# Patient Record
Sex: Female | Born: 1990 | Race: White | Hispanic: No | Marital: Single | Smoking: Former smoker
Health system: Southern US, Community
[De-identification: ages and names within clinical notes are randomized; demographics above are authoritative.]

## PROBLEM LIST (undated history)

## (undated) ENCOUNTER — Ambulatory Visit: Payer: BC Managed Care – PPO | Source: Home / Self Care

## (undated) DIAGNOSIS — F419 Anxiety disorder, unspecified: Secondary | ICD-10-CM

## (undated) DIAGNOSIS — IMO0002 Reserved for concepts with insufficient information to code with codable children: Secondary | ICD-10-CM

## (undated) HISTORY — DX: Reserved for concepts with insufficient information to code with codable children: IMO0002

## (undated) HISTORY — PX: NECK SURGERY: SHX720

---
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
## 2009-09-19 ENCOUNTER — Emergency Department: Payer: Self-pay | Admitting: Emergency Medicine

## 2010-03-24 ENCOUNTER — Emergency Department: Payer: Self-pay | Admitting: Emergency Medicine

## 2010-03-25 ENCOUNTER — Emergency Department: Payer: Self-pay | Admitting: Emergency Medicine

## 2010-03-25 IMAGING — CR DG KNEE COMPLETE 4+V*L*
1 series · 4 of 4 positions shown · non-contrast
Comparison: none

REASON FOR EXAM: trauma, pain
COMMENTS:

PROCEDURE:     DXR - DXR KNEE LT COMP WITH OBLIQUES  - [DATE]  [DATE]
RESULT:     Bipartite patella is noted. No evidence of acute fracture. Soft
tissues are normal.

[Series 1: view not recorded · 0.17mm/px · 4 of 4 slices shown]
[im 1/4]
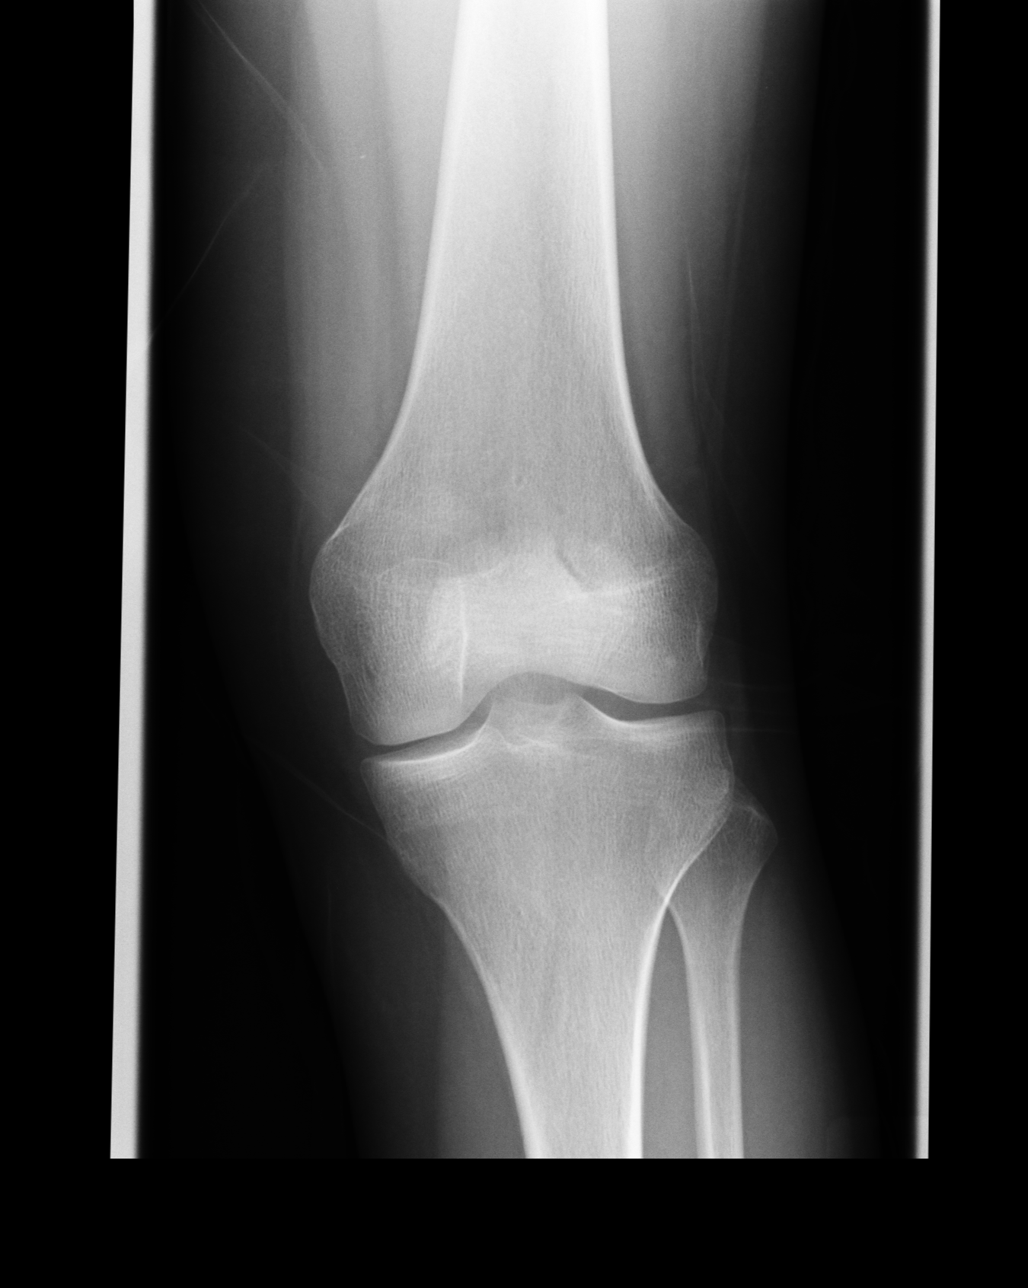
[im 2/4]
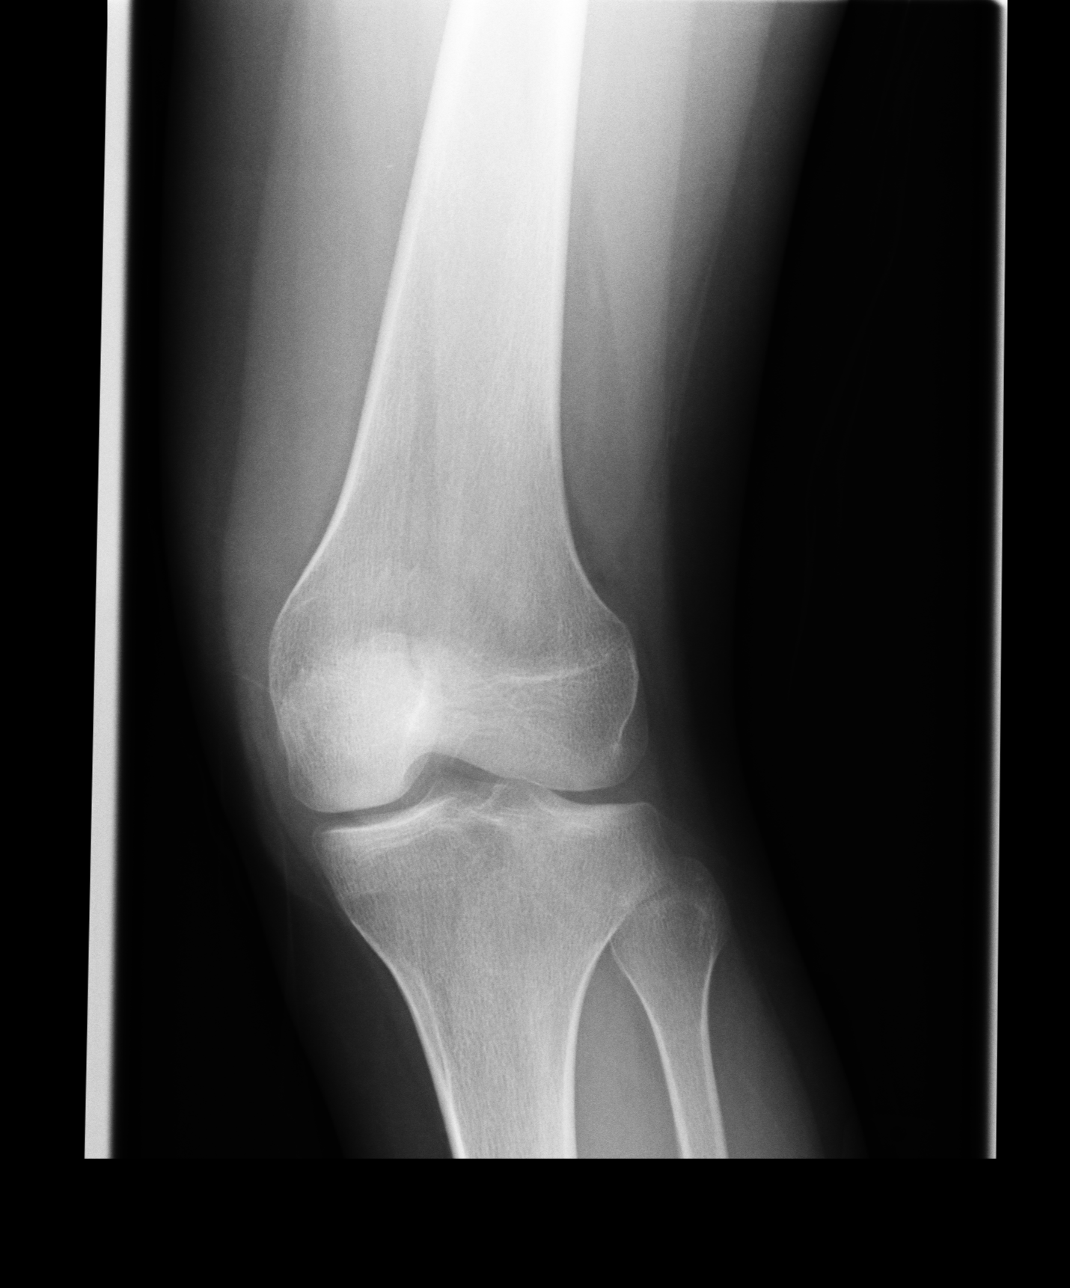
[im 3/4]
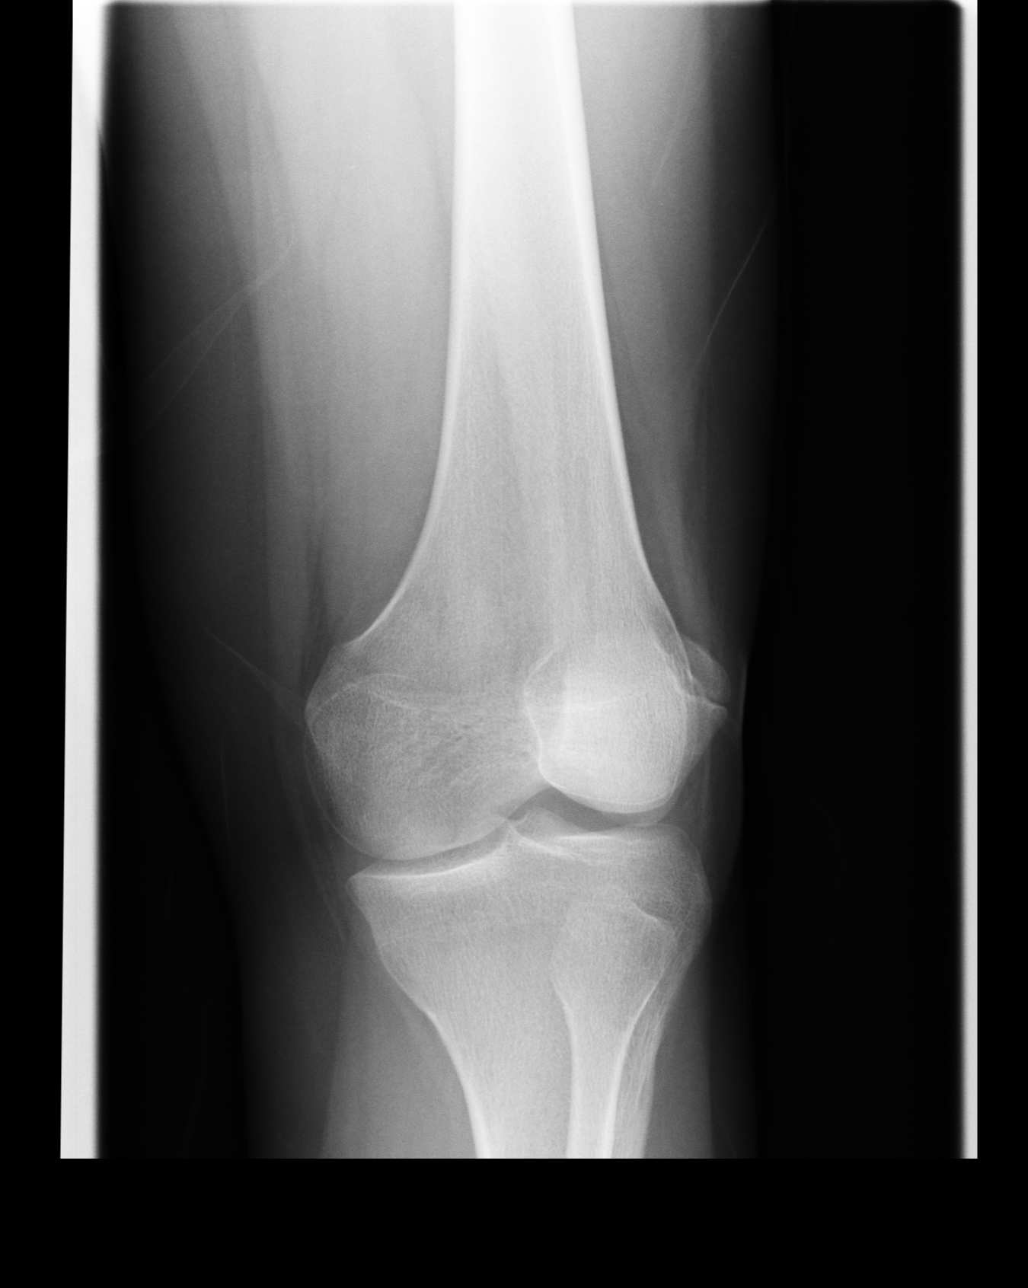
[im 4/4]
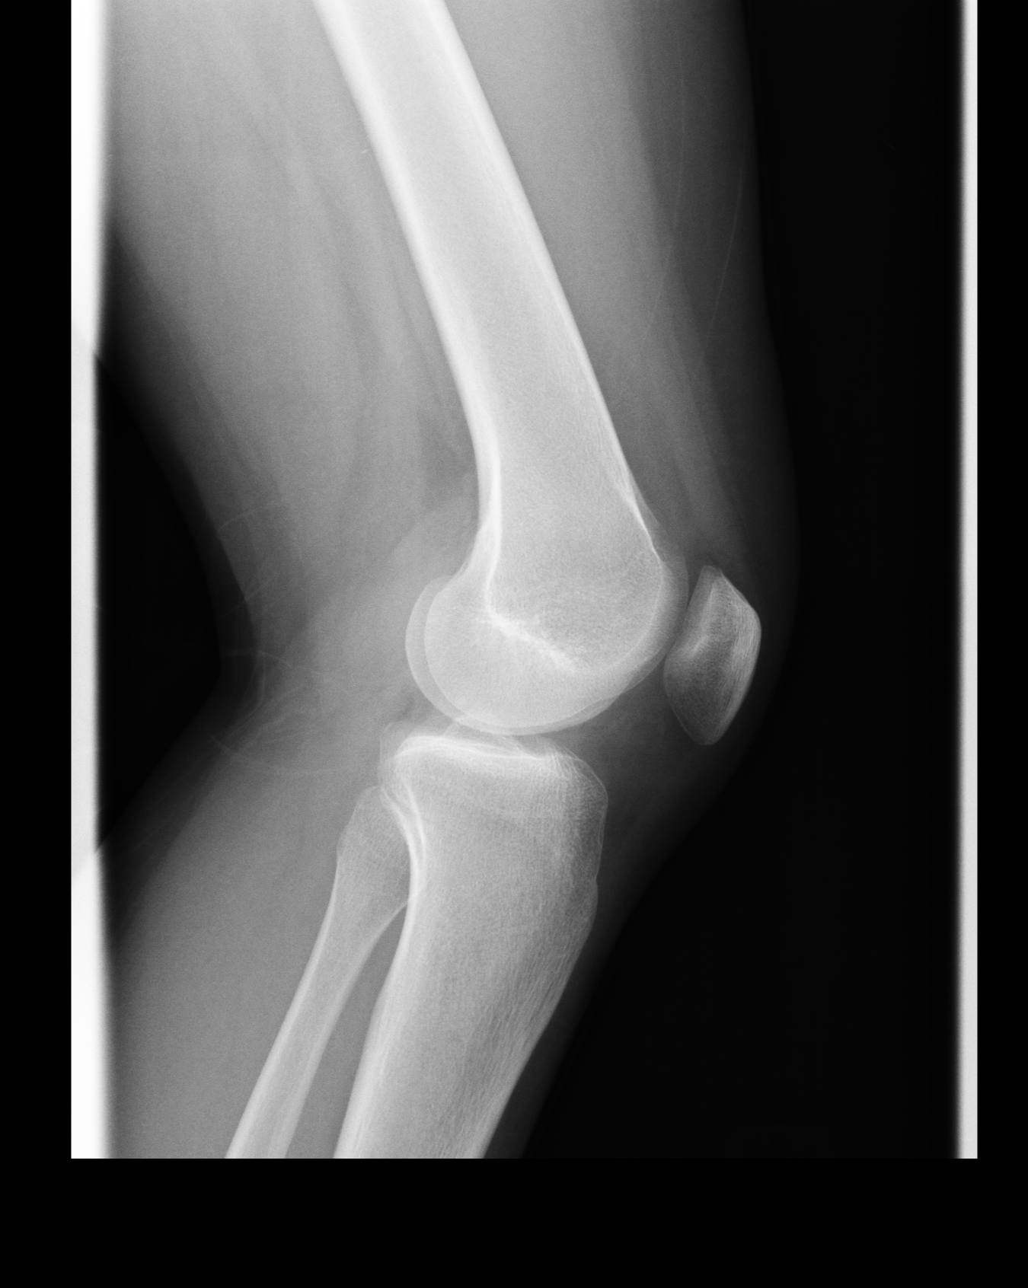

[4 of 4 positions shown; findings below may reference images not displayed]

IMPRESSION: No acute abnormality. Bipartite patella is noted.

## 2011-12-26 ENCOUNTER — Emergency Department: Payer: Self-pay | Admitting: Emergency Medicine

## 2011-12-26 LAB — URINALYSIS, COMPLETE
Bilirubin,UR: NEGATIVE
Glucose,UR: NEGATIVE mg/dL (ref 0–75)
Ketone: NEGATIVE
Protein: 100
RBC,UR: 682 /HPF (ref 0–5)
Specific Gravity: 1.024 (ref 1.003–1.030)
Squamous Epithelial: 1
WBC UR: 1374 /HPF (ref 0–5)

## 2012-04-09 ENCOUNTER — Emergency Department: Payer: Self-pay | Admitting: Emergency Medicine

## 2012-04-09 LAB — URINALYSIS, COMPLETE
RBC,UR: 7 /HPF (ref 0–5)
Specific Gravity: 1.019 (ref 1.003–1.030)
Squamous Epithelial: 4
WBC UR: 272 /HPF (ref 0–5)

## 2012-04-11 LAB — URINE CULTURE

## 2012-07-22 ENCOUNTER — Emergency Department: Payer: Self-pay | Admitting: Emergency Medicine

## 2012-07-22 LAB — URINALYSIS, COMPLETE
Blood: NEGATIVE
Glucose,UR: NEGATIVE mg/dL (ref 0–75)
Ketone: NEGATIVE
Protein: NEGATIVE
RBC,UR: 1 /HPF (ref 0–5)
Specific Gravity: 1.013 (ref 1.003–1.030)

## 2012-07-22 LAB — WET PREP, GENITAL

## 2012-11-07 ENCOUNTER — Emergency Department: Payer: Self-pay | Admitting: Emergency Medicine

## 2012-11-07 LAB — WET PREP, GENITAL

## 2012-11-07 LAB — CBC
HCT: 43.3 % (ref 35.0–47.0)
HGB: 14.8 g/dL (ref 12.0–16.0)
MCH: 31 pg (ref 26.0–34.0)
MCHC: 34.1 g/dL (ref 32.0–36.0)
MCV: 91 fL (ref 80–100)
Platelet: 216 10*3/uL (ref 150–440)
RDW: 12.8 % (ref 11.5–14.5)
WBC: 6.2 10*3/uL (ref 3.6–11.0)

## 2012-11-07 LAB — COMPREHENSIVE METABOLIC PANEL
Albumin: 4.1 g/dL (ref 3.4–5.0)
Anion Gap: 7 (ref 7–16)
BUN: 9 mg/dL (ref 7–18)
Calcium, Total: 9.1 mg/dL (ref 8.5–10.1)
Co2: 27 mmol/L (ref 21–32)
Creatinine: 0.76 mg/dL (ref 0.60–1.30)
EGFR (African American): >60
EGFR (Non-African Amer.): >60
Glucose: 83 mg/dL (ref 65–99)
Potassium: 4 mmol/L (ref 3.5–5.1)
SGPT (ALT): 27 U/L (ref 12–78)
Sodium: 139 mmol/L (ref 136–145)
Total Protein: 7.6 g/dL (ref 6.4–8.2)

## 2012-11-07 LAB — URINALYSIS, COMPLETE
Bacteria: NONE SEEN
Bilirubin,UR: NEGATIVE
Ketone: NEGATIVE
Leukocyte Esterase: NEGATIVE
Nitrite: NEGATIVE
Ph: 6 (ref 4.5–8.0)
RBC,UR: 1 /HPF (ref 0–5)
Squamous Epithelial: 1
WBC UR: 2 /HPF (ref 0–5)

## 2012-11-07 LAB — GC/CHLAMYDIA PROBE AMP

## 2012-11-07 LAB — LIPASE, BLOOD: Lipase: 159 U/L (ref 73–393)

## 2012-11-27 ENCOUNTER — Emergency Department: Payer: Self-pay | Admitting: Internal Medicine

## 2012-11-27 LAB — URINALYSIS, COMPLETE
Bilirubin,UR: NEGATIVE
Glucose,UR: NEGATIVE mg/dL (ref 0–75)
Ketone: NEGATIVE
Leukocyte Esterase: NEGATIVE
Nitrite: NEGATIVE
Ph: 5 (ref 4.5–8.0)
Specific Gravity: 1.024 (ref 1.003–1.030)
Squamous Epithelial: 20

## 2013-01-12 ENCOUNTER — Emergency Department: Payer: Self-pay | Admitting: Emergency Medicine

## 2013-01-20 ENCOUNTER — Emergency Department: Payer: Self-pay | Admitting: Emergency Medicine

## 2013-01-20 LAB — URINALYSIS, COMPLETE
Bilirubin,UR: NEGATIVE
Blood: NEGATIVE
Ketone: NEGATIVE
Protein: NEGATIVE
RBC,UR: 7 /HPF (ref 0–5)
Specific Gravity: 1.02 (ref 1.003–1.030)
Squamous Epithelial: 2

## 2013-01-22 LAB — URINE CULTURE

## 2013-07-07 ENCOUNTER — Emergency Department: Payer: Self-pay | Admitting: Emergency Medicine

## 2013-07-07 LAB — CBC
HCT: 40.5 % (ref 35.0–47.0)
HGB: 13.2 g/dL (ref 12.0–16.0)
MCH: 30.1 pg (ref 26.0–34.0)
MCHC: 32.5 g/dL (ref 32.0–36.0)
MCV: 93 fL (ref 80–100)
Platelet: 232 10*3/uL (ref 150–440)
RBC: 4.38 10*6/uL (ref 3.80–5.20)
RDW: 13.1 % (ref 11.5–14.5)
WBC: 6.5 10*3/uL (ref 3.6–11.0)

## 2013-07-07 LAB — WET PREP, GENITAL

## 2013-07-07 LAB — GC/CHLAMYDIA PROBE AMP

## 2014-01-04 ENCOUNTER — Emergency Department: Payer: Self-pay | Admitting: Emergency Medicine

## 2014-01-04 LAB — URINALYSIS, COMPLETE
Bacteria: NONE SEEN
Bilirubin,UR: NEGATIVE
Glucose,UR: NEGATIVE mg/dL (ref 0–75)
Ketone: NEGATIVE
Leukocyte Esterase: NEGATIVE
Nitrite: NEGATIVE
PH: 6 (ref 4.5–8.0)
Protein: NEGATIVE
RBC,UR: 92 /HPF (ref 0–5)
SPECIFIC GRAVITY: 1.023 (ref 1.003–1.030)
Squamous Epithelial: 1

## 2014-01-04 LAB — WET PREP, GENITAL

## 2014-01-04 LAB — GC/CHLAMYDIA PROBE AMP

## 2014-01-06 LAB — URINE CULTURE

## 2014-09-15 ENCOUNTER — Emergency Department
Admission: EM | Admit: 2014-09-15 | Discharge: 2014-09-15 | Disposition: A | Discharge status: Home / Self Care | Payer: BLUE CROSS/BLUE SHIELD | Attending: Emergency Medicine | Admitting: Emergency Medicine

## 2014-09-15 ENCOUNTER — Encounter: Payer: Self-pay | Admitting: Emergency Medicine

## 2014-09-15 DIAGNOSIS — Z3202 Encounter for pregnancy test, result negative: Secondary | ICD-10-CM | POA: Diagnosis not present

## 2014-09-15 DIAGNOSIS — B373 Candidiasis of vulva and vagina: Secondary | ICD-10-CM | POA: Insufficient documentation

## 2014-09-15 DIAGNOSIS — Z720 Tobacco use: Secondary | ICD-10-CM | POA: Insufficient documentation

## 2014-09-15 DIAGNOSIS — N898 Other specified noninflammatory disorders of vagina: Secondary | ICD-10-CM | POA: Diagnosis present

## 2014-09-15 DIAGNOSIS — B3731 Acute candidiasis of vulva and vagina: Secondary | ICD-10-CM

## 2014-09-15 LAB — URINALYSIS COMPLETE WITH MICROSCOPIC (ARMC ONLY)
Bilirubin Urine: NEGATIVE
GLUCOSE, UA: NEGATIVE mg/dL
Hgb urine dipstick: NEGATIVE
KETONES UR: NEGATIVE mg/dL
Nitrite: NEGATIVE
PH: 5 (ref 5.0–8.0)
Protein, ur: NEGATIVE mg/dL
Specific Gravity, Urine: 1.021 (ref 1.005–1.030)

## 2014-09-15 LAB — CHLAMYDIA/NGC RT PCR (ARMC ONLY)
Chlamydia Tr: NOT DETECTED
N gonorrhoeae: NOT DETECTED

## 2014-09-15 LAB — WET PREP, GENITAL
CLUE CELLS WET PREP: NONE SEEN
Trich, Wet Prep: NONE SEEN

## 2014-09-15 MED ORDER — FLUCONAZOLE 150 MG PO TABS: 150.0000 mg | ORAL_TABLET | Freq: Every day | ORAL | Status: DC

## 2014-09-15 NOTE — Discharge Instructions (Signed)

## 2014-09-15 NOTE — ED Notes (Signed)
POCT urine preg= NEGATIVE 

## 2014-09-15 NOTE — ED Notes (Signed)
States she noticed some vaginal itching and discharge 2 days ago

## 2014-09-15 NOTE — ED Provider Notes (Signed)
[facility] Emergency Department Provider Note  ____________________________________________  Time seen: Approximately 11:33 AM  I have reviewed the triage vital signs and the nursing notes.   HISTORY  Chief Complaint Vaginal Discharge   HPI Darlene Miller is a 24 y.o. female is here today with complaint of vaginal itching and discomfort charged for approximately 2 days. She states he had same last month and was seen at [facility]. She states at that time she was given medication and it got better. Now the discharge is back. Currently her pain is 2 out of 10.Patient states that she was sexually active approximately one week ago when her partner lost the condom. She was able to retrieve it the next day. To her knowledge her partner is not having any problems.   History reviewed. No pertinent past medical history.  There are no active problems to display for this patient.   History reviewed. No pertinent past surgical history.  Current Outpatient Rx  Name  Route  Sig  Dispense  Refill  . fluconazole (DIFLUCAN) 150 MG tablet   Oral   Take 1 tablet (150 mg total) by mouth daily.   1 tablet   0     Allergies Review of patient's allergies indicates no known allergies.  No family history on file.  Social History Social History  Substance Use Topics  . Smoking status: Light Tobacco Smoker  . Smokeless tobacco: None  . Alcohol Use: Yes    Review of Systems Constitutional: No fever/chills Eyes: No visual changes. ENT: No sore throat. Cardiovascular: Denies chest pain. Respiratory: Denies shortness of breath. Gastrointestinal: No abdominal pain.  No nausea, no vomiting.  Genitourinary: Negative for dysuria. Musculoskeletal: Negative for back pain. Skin: Negative for rash. Neurological: Negative for headaches, focal weakness or numbness.  10-point ROS otherwise negative.  ____________________________________________   PHYSICAL  EXAM:  VITAL SIGNS: ED Triage Vitals  Enc Vitals Group     BP 09/15/14 1011 104/62 mmHg     Pulse Rate 09/15/14 1011 77     Resp 09/15/14 1011 18     Temp 09/15/14 1011 98.2 F (36.8 C)     Temp Source 09/15/14 1011 Oral     SpO2 09/15/14 1011 99 %     Weight 09/15/14 1011 107 lb (48.535 kg)     Height 09/15/14 1011 5' (1.524 m)     Head Cir --      Peak Flow --      Pain Score 09/15/14 1012 2     Pain Loc --      Pain Edu? --      Excl. in GC? --     Constitutional: Alert and oriented. Well appearing and in no acute distress. Eyes: Conjunctivae are normal. PERRL. EOMI. Head: Atraumatic. Nose: No congestion/rhinnorhea. Neck: No stridor.   Cardiovascular: Normal rate, regular rhythm. Grossly normal heart sounds.  Good peripheral circulation. Pelvic: There is moderate white discharge present in the vaginal vault. Cervical os is not irritated at this time. There is no adnexal masses or tenderness present. There is no tenderness on cervical motion. Respiratory: Normal respiratory effort.  No retractions. Lungs CTAB. Gastrointestinal: Soft and nontender. No distention. Musculoskeletal: No lower extremity tenderness nor edema.  No joint effusions. Neurologic:  Normal speech and language. No gross focal neurologic deficits are appreciated. No gait instability. Skin:  Skin is warm, dry and intact. No rash noted. Psychiatric: Mood and affect are normal. Speech and behavior are normal.  ____________________________________________  LABS (all labs ordered are listed, but only abnormal results are displayed)  Labs Reviewed  WET PREP, GENITAL - Abnormal; Notable for the following:    Yeast Wet Prep HPF POC FEW (*)    WBC, Wet Prep HPF POC MANY (*)    All other components within normal limits  URINALYSIS COMPLETEWITH MICROSCOPIC ([facility] ONLY) - Abnormal; Notable for the following:    Color, Urine YELLOW (*)    APPearance HAZY (*)    Leukocytes, UA 2+ (*)    Bacteria, UA RARE (*)     Squamous Epithelial / LPF 6-30 (*)    All other components within normal limits  CHLAMYDIA/NGC RT PCR ([facility] ONLY)  POC URINE PREG, ED    PROCEDURES  Procedure(s) performed: None  Critical Care performed: No  ____________________________________________   INITIAL IMPRESSION / ASSESSMENT AND PLAN / ED COURSE  Pertinent labs & imaging results that were available during my care of the patient were reviewed by me and considered in my medical decision making (see chart for details).  Patient is given a prescription for Diflucan 150 mg 1 tablet. She is to follow-up with Dr. Feliberto Gottron if any continued problems. ____________________________________________   FINAL CLINICAL IMPRESSION(S) / ED DIAGNOSES  Final diagnoses:  Yeast vaginitis      Tommi Rumps, PA-C 09/15/14 1535  Emily Filbert, MD 09/16/14 [phone]

## 2015-01-18 ENCOUNTER — Encounter: Payer: BLUE CROSS/BLUE SHIELD | Admitting: Obstetrics and Gynecology

## 2015-02-23 ENCOUNTER — Encounter: Payer: Self-pay | Admitting: Obstetrics and Gynecology

## 2015-02-23 ENCOUNTER — Ambulatory Visit (INDEPENDENT_AMBULATORY_CARE_PROVIDER_SITE_OTHER): Payer: BLUE CROSS/BLUE SHIELD | Admitting: Obstetrics and Gynecology

## 2015-02-23 VITALS — BP 98/63 | HR 76 | Ht 60.0 in | Wt 106.2 lb

## 2015-02-23 DIAGNOSIS — R87612 Low grade squamous intraepithelial lesion on cytologic smear of cervix (LGSIL): Secondary | ICD-10-CM

## 2015-02-23 NOTE — Progress Notes (Signed)
GYN ENCOUNTER NOTE  Subjective:       Darlene Miller is a 25 y.o. G0P0000 female is here for gynecologic evaluation of the following issues:  1. 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     Gynecologic History Patient's last menstrual period was 02/18/2015. Contraception: condoms Last Pap: October 2016. LGSIL/positive Currently monogamous 1 year. Lifetime partners-5 History of chlamydia 2; last infection greater than 1 year ago. Light smoker  Obstetric History OB History  Gravida Para Term Preterm AB SAB TAB Ectopic Multiple Living         Past Medical History  Diagnosis Date  . 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 Neck surgery      mass on neck- benign    No current outpatient prescriptions on file prior to visit.   No current facility-administered medications on file prior to visit.    No Known Allergies  Social History   Social History  . Marital Status: Single    Spouse Name: N/A  . Number of Children: N/A  . Years of Education: N/A   Occupational History  . Not on file.   Social History Main Topics  . Smoking status: Light Tobacco Smoker -- 0.25 packs/day    Types: Cigarettes  . Smokeless tobacco: Not on file  . Alcohol Use: Yes     Comment: weekends  . Drug Use: No  . Sexual Activity: Yes    Birth Control/ Protection: Condom   Other Topics Concern  . Not on file   Social History Narrative    Family History  Problem Relation Age of Onset  . Cancer Neg Hx   . Diabetes Neg Hx   . Heart disease Neg Hx     The following portions of the patient's history were reviewed and updated as appropriate: allergies, current medications, past family history, past medical history, past social history, past  surgical history and problem list.  Review of Systems Comprehensive review of systems negative  Objective:   BP 98/63 mmHg  Pulse 76  Ht 5' (1.524 m)  Wt 106 lb 3.2 oz (48.172 kg)  BMI 20.74 kg/m2  LMP 02/18/2015 CONSTITUTIONAL: Well-developed, well-nourished female in no acute distress.  HENT:  Normocephalic, atraumatic.  NECK: Not examined SKIN: Skin is warm and dry. No rash noted. Not diaphoretic. No erythema. No pallor. NEUROLGIC: Alert and oriented to person, place, and time. PSYCHIATRIC: Normal mood and affect. Normal behavior. Normal judgment and thought content. CARDIOVASCULAR:Not Examined RESPIRATORY: Not Examined BREASTS: Not Examined ABDOMEN: Soft, non distended; Non tender.  No Organomegaly. PELVIC:  External Genitalia: Normal  BUS: Normal  Vagina: Normal  Cervix: Normal  Uterus: Bimanual not done  Adnexa: Not examined  RV: Normal .  External exam  Bladder: Nontender MUSCULOSKELETAL: Normal range of motion. No tenderness.  No cyanosis, clubbing, or edema.  PROCEDURE: Colposcopy with biopsies. Patient was placed in the dorsal lithotomy position.  Speculum was placed.  Acetic acid was used topically on the cervix and vagina.  Colposcopy was inadequate as the squamocolumnar junction could not be fully visualized. Abnormal lesions-punctation at 6:00. IMPRESSION: 1.  LGSIL/positive Pap smear 10 2016 2.  Colposcopy today inadequate; punctation at 6:00. 3.  Light smoker. PLAN: 1.  ECC.  2.  Cervical biopsy 6:00. 3.  Return in 2 weeks for follow-up. 4.  Smoking cessation Encouraged 5.  HPV discussion was completed with recommendations regarding management including smoking cessation, condom use, and maintaining monogamy.  Herold Harms, MD  Note: This dictation was prepared with Dragon dictation along with smaller phrase technology. Any transcriptional errors that result from this process are unintentional.       Assessment:   1. LGSIL Pap smear  of Cervix  - Pathology ECC - Pathology Cervical biopsy, 6:00     Plan:   1.  ECC. 2.  Cervical biopsy 6:00. 3.  Return in 2 weeks for follow-up. 4.  Smoking cessation Encouraged 5.  HPV discussion was completed with recommendations regarding management including smoking cessation, condom use, and maintaining monogamy.  Herold Harms, MD

## 2015-02-23 NOTE — Patient Instructions (Signed)
COLPOSCOPY POST-PROCEDURE INSTRUCTIONS  1. You may take Ibuprofen, Aleve or Tylenol for cramping if needed.  2. If Monsel's solution was used, you will have a black discharge.  3. Light bleeding is normal.  If bleeding is heavier than your period, please call.  4. Put nothing in your vagina until the bleeding or discharge stops (usually 2 or3 days).  5. We will call you within one week with biopsy results or discuss the results at your follow-up appointment if needed. 6.  

## 2015-02-25 LAB — PATHOLOGY

## 2015-03-07 ENCOUNTER — Encounter: Payer: Self-pay | Admitting: Obstetrics and Gynecology

## 2015-03-09 ENCOUNTER — Encounter: Payer: Self-pay | Admitting: Obstetrics and Gynecology

## 2015-03-09 ENCOUNTER — Ambulatory Visit (INDEPENDENT_AMBULATORY_CARE_PROVIDER_SITE_OTHER): Payer: BLUE CROSS/BLUE SHIELD | Admitting: Obstetrics and Gynecology

## 2015-03-09 VITALS — BP 108/67 | HR 79 | Ht 60.0 in | Wt 109.2 lb

## 2015-03-09 DIAGNOSIS — R87612 Low grade squamous intraepithelial lesion on cytologic smear of cervix (LGSIL): Secondary | ICD-10-CM

## 2015-03-09 NOTE — Patient Instructions (Addendum)
1. Return in 6 months for colposcopy and Pap smear

## 2015-03-09 NOTE — Progress Notes (Signed)
Chief complaint: 1.  Follow-up on colposcopic directed biopsies. 2.  History of LGSIL Pap smear.  Patient presents for follow-up on colposcopic directed biopsies. Colposcopy was inadequate; punctation was noted at 6:00.  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 ECC-negative.  Findings were reviewed with the patient.  No dysplasia was identified.  ASSESSMENT: 1.  History of LGSIL Pap smear. 2.  Follow-up colposcopy demonstrated koilocytosis only.  PLAN: 1.  Quit smoking. 2.  Use condoms. 3.  Return in 6 months for repeat colposcopy and Pap smear  A total of 15 minutes were spent face-to-face with the patient during this encounter and over half of that time dealt with counseling and coordination of care.  Herold Harms, MD  Note: This dictation was prepared with Dragon dictation along with smaller phrase technology. Any transcriptional errors that result from this process are unintentional.

## 2015-09-07 ENCOUNTER — Encounter: Payer: BLUE CROSS/BLUE SHIELD | Admitting: Obstetrics and Gynecology

## 2015-09-14 ENCOUNTER — Encounter: Payer: BLUE CROSS/BLUE SHIELD | Admitting: Obstetrics and Gynecology

## 2015-11-25 ENCOUNTER — Other Ambulatory Visit: Payer: Self-pay | Admitting: Advanced Practice Midwife

## 2015-11-25 DIAGNOSIS — Z3401 Encounter for supervision of normal first pregnancy, first trimester: Secondary | ICD-10-CM

## 2015-11-26 LAB — OB RESULTS CONSOLE HGB/HCT, BLOOD
HEMATOCRIT: 41 %
Hemoglobin: 13.8 g/dL

## 2015-11-26 LAB — OB RESULTS CONSOLE HIV ANTIBODY (ROUTINE TESTING): HIV: NONREACTIVE

## 2015-11-26 LAB — OB RESULTS CONSOLE ABO/RH: RH Type: POSITIVE

## 2015-11-26 LAB — OB RESULTS CONSOLE TB SKIN TEST: CHL TB SkinTest: NEGATIVE

## 2015-11-26 LAB — OB RESULTS CONSOLE ANTIBODY SCREEN: ANTIBODY SCREEN: NEGATIVE

## 2015-11-26 LAB — OB RESULTS CONSOLE HEPATITIS B SURFACE ANTIGEN: Hepatitis B Surface Ag: NEGATIVE

## 2015-11-26 LAB — OB RESULTS CONSOLE RUBELLA ANTIBODY, IGM: Rubella: IMMUNE

## 2015-11-26 LAB — OB RESULTS CONSOLE VARICELLA ZOSTER ANTIBODY, IGG: Varicella: IMMUNE

## 2015-11-26 LAB — OB RESULTS CONSOLE PLATELET COUNT: Platelets: 284 10*3/uL

## 2015-11-26 LAB — OB RESULTS CONSOLE RPR: RPR: NONREACTIVE

## 2015-11-26 LAB — OB RESULTS CONSOLE GC/CHLAMYDIA
Chlamydia: NEGATIVE
Gonorrhea: NEGATIVE

## 2015-11-29 ENCOUNTER — Encounter: Payer: Self-pay | Admitting: Radiology

## 2015-11-29 ENCOUNTER — Ambulatory Visit
Admission: RE | Admit: 2015-11-29 | Discharge: 2015-11-29 | Disposition: A | Discharge status: Home / Self Care | Payer: BLUE CROSS/BLUE SHIELD | Source: Ambulatory Visit | Attending: Advanced Practice Midwife | Admitting: Advanced Practice Midwife

## 2015-11-29 DIAGNOSIS — Z3401 Encounter for supervision of normal first pregnancy, first trimester: Secondary | ICD-10-CM | POA: Diagnosis present

## 2015-11-29 DIAGNOSIS — Z3A09 9 weeks gestation of pregnancy: Secondary | ICD-10-CM | POA: Insufficient documentation

## 2016-01-05 ENCOUNTER — Encounter: Payer: Self-pay | Admitting: Obstetrics and Gynecology

## 2016-01-05 ENCOUNTER — Ambulatory Visit (INDEPENDENT_AMBULATORY_CARE_PROVIDER_SITE_OTHER): Payer: BLUE CROSS/BLUE SHIELD | Admitting: Obstetrics and Gynecology

## 2016-01-05 VITALS — BP 97/59 | HR 93 | Wt 113.7 lb

## 2016-01-05 DIAGNOSIS — F1290 Cannabis use, unspecified, uncomplicated: Secondary | ICD-10-CM

## 2016-01-05 DIAGNOSIS — R519 Headache, unspecified: Secondary | ICD-10-CM

## 2016-01-05 DIAGNOSIS — Z3402 Encounter for supervision of normal first pregnancy, second trimester: Secondary | ICD-10-CM

## 2016-01-05 DIAGNOSIS — Z113 Encounter for screening for infections with a predominantly sexual mode of transmission: Secondary | ICD-10-CM

## 2016-01-05 DIAGNOSIS — Z369 Encounter for antenatal screening, unspecified: Secondary | ICD-10-CM

## 2016-01-05 DIAGNOSIS — R51 Headache: Secondary | ICD-10-CM

## 2016-01-05 MED ORDER — BUTALBITAL-APAP-CAFFEINE 50-325-40 MG PO CAPS: 1.0000 | ORAL_CAPSULE | Freq: Four times a day (QID) | ORAL | 3 refills | Status: DC | PRN

## 2016-01-05 NOTE — Progress Notes (Signed)
TRANSFER IN OB HISTORY AND PHYSICAL  SUBJECTIVE:       Darlene Miller is a 25 y.o. 341P0000 female, with unknown LMP, Estimated Date of Delivery: 06/27/16, [id number], by 9 week ER ultrasound who presents today for Transition of Prenatal Care. Patient is transitioning from [facility]. Complaints today include: headache several times daily.  Has had recent eye exam.  Denies nasal congestion or sinus pressure.  Denies any stressors. Uses Tylenol ES with modest relief. Does not have a h/o headaches prior to pregnancy.     Gynecologic History No LMP recorded as patient was unsure. Contraception: none Last Pap: 11/2014. Results were: abnormal (LGSIL).  Is s/p colposcopy in 02/2015 with normal ECC and cervical biopsy showing chronic cervicitis but otherwise normal.  Has h/o 2 abnormal pap smears in the past.  Denies h/o STIs.   Obstetric History OB History  Gravida Para Term Preterm AB Living  1 0 0 0 0 0  SAB TAB Ectopic Multiple Live Births  0 0 0 0      # Outcome Date GA Lbr Len/2nd Weight Sex Delivery Anes PTL Lv  1 Current               Past Medical History:  Diagnosis Date  . 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 NECK SURGERY     mass on neck- benign    No current outpatient prescriptions on file prior to visit.   No current facility-administered medications on file prior to visit.     No Known Allergies  Social History   Social History  . Marital status: Single    Spouse name: N/A  . Number of children: N/A  . Years of education: N/A   Occupational History  . Not on file.   Social History Main Topics  . Smoking status: Former Smoker    Packs/day: 0.25    Types: Cigarettes    Quit date: 08/06/2015  . Smokeless tobacco: Never Used  . Alcohol use Yes     Comment: weekends  . Drug use:     Types: Marijuana  . Sexual activity: Yes    Birth control/ protection: None     Comment:  Pregnant    Other Topics Concern  . Not on file   Social History Narrative  . No narrative on file    Family History  Problem Relation Age of Onset  . Hypertension Mother   . Restless legs syndrome Mother   . Cancer Neg Hx   . Diabetes Neg Hx   . Heart disease Neg Hx     The following portions of the patient's history were reviewed and updated as appropriate: allergies, current medications, past OB history, past medical history, past surgical history, past family history, past social history, and problem list.   OBJECTIVE: Initial Physical Exam (New OB)  GENERAL APPEARANCE: alert, well appearing HEAD: normocephalic, atraumatic MOUTH: mucous membranes moist, pharynx normal without lesions THYROID: no thyromegaly or masses present BREASTS: no masses noted, no significant tenderness, no palpable axillary nodes, no skin changes LUNGS: clear to auscultation, no wheezes, rales or rhonchi, symmetric air entry HEART: regular rate and rhythm, no murmurs ABDOMEN: soft, nontender, nondistended, no abnormal masses, no epigastric pain EXTREMITIES: no redness or tenderness in the calves or thighs SKIN: normal coloration and turgor, no rashes LYMPH NODES: no adenopathy palpable NEUROLOGIC: alert, oriented, normal speech, no focal findings or  movement disorder noted  PELVIC EXAM: exam deferred  ASSESSMENT: Normal pregnancy at 15 weeks Marijuana use Headaches (daily)  PLAN: Continue routine PNC Will get records from [facility].  For anatomy scan next visit For flu vaccine next visit.  New OB counseling: The patient has been given an overview regarding routine prenatal care.  Recommendations regarding diet, weight gain, and exercise in pregnancy were given. Prenatal testing, optional genetic testing, and ultrasound use in pregnancy were reviewed. Patient is a candidate for 2nd trimester screen next visit if no genetic screening had been previously performed at [facility].  Benefits of Breast  Feeding were discussed. The patient is encouraged to consider nursing her baby post partum. Prescribed Fioricet for headaches.  Counseled on marijuana cesattion.   Return to clinic in 4 weeks. For anatomy scan next visit.    Hildred LaserAnika Ivon Oelkers, MD [facility]

## 2016-01-05 NOTE — Patient Instructions (Addendum)
Minor Illnesses and Medications in Pregnancy  Cold/Flu:  Sudafed for congestion- Robitussin (plain) for cough- Tylenol for discomfort.  Please follow the directions on the label.  Try not to take any more than needed.  OTC Saline nasal spray and air humidifier or cool-mist  Vaporizer to sooth nasal irritation and to loosen congestion.  It is also important to increase intake of non carbonated fluids, especially if you have a fever.  Constipation:  Colace-2 capsules at bedtime; Metamucil- follow directions on label; Senokot- 1 tablet at bedtime.  Any one of these medications can be used.  It is also very important to increase fluids and fruits along with regular exercise.  If problem persists please call the office.  Diarrhea:  Kaopectate as directed on the label.  Eat a bland diet and increase fluids.  Avoid highly seasoned foods.  Headache:  Tylenol 1 or 2 tablets every 3-4 hours as needed  Indigestion:  Maalox, Mylanta, Tums or Rolaids- as directed on label.  Also try to eat small meals and avoid fatty, greasy or spicy foods.  Nausea with or without Vomiting:  Nausea in pregnancy is caused by increased levels of hormones in the body which influence the digestive system and cause irritation when stomach acids accumulate.  Symptoms usually subside after 1st trimester of pregnancy.  Try the following: 1. Keep saltines, graham crackers or dry toast by your bed to eat upon awakening. 2. Don't let your stomach get empty.  Try to eat 5-6 small meals per day instead of 3 large ones. 3. Avoid greasy fatty or highly seasoned foods.  4. Take OTC Unisom 1 tablet at bed time along with OTC Vitamin B6 25-50 mg 3 times per day.    If nausea continues with vomiting and you are unable to keep down food and fluids you may need a prescription medication.  Please notify your provider.   Sore throat:  Chloraseptic spray, throat lozenges and or plain Tylenol.  Vaginal Yeast Infection:  OTC Monistat for 7 days as  directed on label.  If symptoms do not resolve within a week notify provider.  If any of the above problems do not subside with recommended treatment please call the office for further assistance.   Do not take Aspirin, Advil, Motrin or Ibuprofen.  * * OTC= Over the counter    Second Trimester of Pregnancy The second trimester is from week 13 through week 28 (months 4 through 6). The second trimester is often a time when you feel your best. Your body has also adjusted to being pregnant, and you begin to feel better physically. Usually, morning sickness has lessened or quit completely, you may have more energy, and you may have an increase in appetite. The second trimester is also a time when the fetus is growing rapidly. At the end of the sixth month, the fetus is about 9 inches long and weighs about 1 pounds. You will likely begin to feel the baby move (quickening) between 18 and 20 weeks of the pregnancy. Body changes during your second trimester Your body continues to go through many changes during your second trimester. The changes vary from woman to woman.  Your weight will continue to increase. You will notice your lower abdomen bulging out.  You may begin to get stretch marks on your hips, abdomen, and breasts.  You may develop headaches that can be relieved by medicines. The medicines should be approved by your health care provider.  You may urinate more often because  the fetus is pressing on your bladder.  You may develop or continue to have heartburn as a result of your pregnancy.  You may develop constipation because certain hormones are causing the muscles that push waste through your intestines to slow down.  You may develop hemorrhoids or swollen, bulging veins (varicose veins).  You may have back pain. This is caused by:  Weight gain.  Pregnancy hormones that are relaxing the joints in your pelvis.  A shift in weight and the muscles that support your balance.  Your  breasts will continue to grow and they will continue to become tender.  Your gums may bleed and may be sensitive to brushing and flossing.  Dark spots or blotches (chloasma, mask of pregnancy) may develop on your face. This will likely fade after the baby is born.  A dark line from your belly button to the pubic area (linea nigra) may appear. This will likely fade after the baby is born.  You may have changes in your hair. These can include thickening of your hair, rapid growth, and changes in texture. Some women also have hair loss during or after pregnancy, or hair that feels dry or thin. Your hair will most likely return to normal after your baby is born. What to expect at prenatal visits During a routine prenatal visit:  You will be weighed to make sure you and the fetus are growing normally.  Your blood pressure will be taken.  Your abdomen will be measured to track your baby's growth.  The fetal heartbeat will be listened to.  Any test results from the previous visit will be discussed. Your health care provider may ask you:  How you are feeling.  If you are feeling the baby move.  If you have had any abnormal symptoms, such as leaking fluid, bleeding, severe headaches, or abdominal cramping.  If you are using any tobacco products, including cigarettes, chewing tobacco, and electronic cigarettes.  If you have any questions. Other tests that may be performed during your second trimester include:  Blood tests that check for:  Low iron levels (anemia).  Gestational diabetes (between 24 and 28 weeks).  Rh antibodies. This is to check for a protein on red blood cells (Rh factor).  Urine tests to check for infections, diabetes, or protein in the urine.  An ultrasound to confirm the proper growth and development of the baby.  An amniocentesis to check for possible genetic problems.  Fetal screens for spina bifida and Down syndrome.  HIV (human immunodeficiency virus)  testing. Routine prenatal testing includes screening for HIV, unless you choose not to have this test. Follow these instructions at home: Eating and drinking  Continue to eat regular, healthy meals.  Avoid raw meat, uncooked cheese, cat litter boxes, and soil used by cats. These carry germs that can cause birth defects in the baby.  Take your prenatal vitamins.  Take 1500-2000 mg of calcium daily starting at the 20th week of pregnancy until you deliver your baby.  If you develop constipation:  Take over-the-counter or prescription medicines.  Drink enough fluid to keep your urine clear or pale yellow.  Eat foods that are high in fiber, such as fresh fruits and vegetables, whole grains, and beans.  Limit foods that are high in fat and processed sugars, such as fried and sweet foods. Activity  Exercise only as directed by your health care provider. Experiencing uterine cramps is a good sign to stop exercising.  Avoid heavy lifting, wear low  heel shoes, and practice good posture.  Wear your seat belt at all times when driving.  Rest with your legs elevated if you have leg cramps or low back pain.  Wear a good support bra for breast tenderness.  Do not use hot tubs, steam rooms, or saunas. Lifestyle  Avoid all smoking, herbs, alcohol, and unprescribed drugs. These chemicals affect the formation and growth of the baby.  Do not use any products that contain nicotine or tobacco, such as cigarettes and e-cigarettes. If you need help quitting, ask your health care provider.  A sexual relationship may be continued unless your health care provider directs you otherwise. General instructions  Follow your health care provider's instructions regarding medicine use. There are medicines that are either safe or unsafe to take during pregnancy.  Take warm sitz baths to soothe any pain or discomfort caused by hemorrhoids. Use hemorrhoid cream if your health care provider approves.  If you  develop varicose veins, wear support hose. Elevate your feet for 15 minutes, 3-4 times a day. Limit salt in your diet.  Visit your dentist if you have not gone yet during your pregnancy. Use a soft toothbrush to brush your teeth and be gentle when you floss.  Keep all follow-up prenatal visits as told by your health care provider. This is important. Contact a health care provider if:  You have dizziness.  You have mild pelvic cramps, pelvic pressure, or nagging pain in the abdominal area.  You have persistent nausea, vomiting, or diarrhea.  You have a bad smelling vaginal discharge.  You have pain with urination. Get help right away if:  You have a fever.  You are leaking fluid from your vagina.  You have spotting or bleeding from your vagina.  You have severe abdominal cramping or pain.  You have rapid weight gain or weight loss.  You have shortness of breath with chest pain.  You notice sudden or extreme swelling of your face, hands, ankles, feet, or legs.  You have not felt your baby move in over an hour.  You have severe headaches that do not go away with medicine.  You have vision changes. Summary  The second trimester is from week 13 through week 28 (months 4 through 6). It is also a time when the fetus is growing rapidly.  Your body goes through many changes during pregnancy. The changes vary from woman to woman.  Avoid all smoking, herbs, alcohol, and unprescribed drugs. These chemicals affect the formation and growth your baby.  Do not use any tobacco products, such as cigarettes, chewing tobacco, and e-cigarettes. If you need help quitting, ask your health care provider.  Contact your health care provider if you have any questions. Keep all prenatal visits as told by your health care provider. This is important. This information is not intended to replace advice given to you by your health care provider. Make sure you discuss any questions you have with your  health care provider. Document Released: 01/16/2001 Document Revised: 06/30/2015 Document Reviewed: 03/25/2012 Elsevier Interactive Patient Education  2017 [employer]

## 2016-01-05 NOTE — Progress Notes (Signed)
ROB

## 2016-01-07 LAB — URINALYSIS, ROUTINE W REFLEX MICROSCOPIC
Bilirubin, UA: NEGATIVE
Glucose, UA: NEGATIVE
KETONES UA: NEGATIVE
LEUKOCYTES UA: NEGATIVE
NITRITE UA: NEGATIVE
PH UA: 6.5 (ref 5.0–7.5)
RBC, UA: NEGATIVE
Specific Gravity, UA: >=1.03 — AB (ref 1.005–1.030)
Urobilinogen, Ur: 0.2 mg/dL (ref 0.2–1.0)

## 2016-01-07 LAB — PAIN MGT SCRN (14 DRUGS), UR
Amphetamine Screen, Ur: NEGATIVE ng/mL
BARBITURATE SCRN UR: NEGATIVE ng/mL
Benzodiazepine Screen, Urine: NEGATIVE ng/mL
Buprenorphine, Urine: NEGATIVE ng/mL
COCAINE(METAB.) SCREEN, URINE: NEGATIVE ng/mL
CREATININE(CRT), U: 193.6 mg/dL (ref 20.0–300.0)
Cannabinoids Ur Ql Scn: POSITIVE ng/mL
FENTANYL, URINE: NEGATIVE pg/mL
MEPERIDINE SCREEN, URINE: NEGATIVE ng/mL
Methadone Scn, Ur: NEGATIVE ng/mL
OPIATE SCRN UR: NEGATIVE ng/mL
OXYCODONE+OXYMORPHONE UR QL SCN: NEGATIVE ng/mL
PCP Scrn, Ur: NEGATIVE ng/mL
PROPOXYPHENE SCREEN: NEGATIVE ng/mL
Ph of Urine: 6.1 (ref 4.5–8.9)
Tramadol Ur Ql Scn: NEGATIVE ng/mL

## 2016-01-08 DIAGNOSIS — F1290 Cannabis use, unspecified, uncomplicated: Secondary | ICD-10-CM | POA: Insufficient documentation

## 2016-01-08 DIAGNOSIS — R51 Headache: Secondary | ICD-10-CM

## 2016-01-08 DIAGNOSIS — R519 Headache, unspecified: Secondary | ICD-10-CM | POA: Insufficient documentation

## 2016-01-08 LAB — URINE CULTURE

## 2016-01-09 ENCOUNTER — Telehealth: Payer: Self-pay | Admitting: Obstetrics and Gynecology

## 2016-01-09 NOTE — Telephone Encounter (Signed)
Patient returned your call. She asked if you could call here back around 330 when she takes her next break at work.Thanks

## 2016-01-09 NOTE — Telephone Encounter (Signed)
I did not call this pt, maybe the automated line?

## 2016-02-03 ENCOUNTER — Other Ambulatory Visit: Payer: Self-pay | Admitting: Obstetrics and Gynecology

## 2016-02-03 DIAGNOSIS — Z369 Encounter for antenatal screening, unspecified: Secondary | ICD-10-CM

## 2016-02-06 DIAGNOSIS — O321XX0 Maternal care for breech presentation, not applicable or unspecified: Secondary | ICD-10-CM

## 2016-02-06 NOTE — L&D Delivery Note (Signed)
     OP NOTE  Date @EDTODAYDATE @ @TIMESTAMP @ Name Darlene Miller MR# [id number]  Preoperative Diagnosis: 1. Intrauterine pregnancy at 5417w2d 2.  IUGR - < 5 % 3.  Breech   Postoperative Diagnosis: 1. Intrauterine pregnancy at 6817w2d, delivered 2. Viable infant 3. Remainder same as pre-op   Procedure: 1. Primary Low-Transverse Cesarean Section  Surgeon: Elonda Huskyavid J. Mackinsey Pelland, MD  Assistant:  none  Anesthesia: spinal  EBL: 600 mL  Findings: 1. Female infant in the breech presentation, with weight 5 lbs,  2. Normal uterus, tubes and ovaries.    Procedure:  The patient was prepped and draped in the supine position and placed under spinal anesthesia.  A transverse incision was made across the abdomen in a Pfannenstiel manner. If indicated the old scar was systematically removed with sharp dissection.  We carried the dissection down to the level of the fascia.  The fascia was incised in a curvilinear manner.  The fascia was then elevated from the rectus muscles with blunt and sharp dissection.  The rectus muscles were separated laterally exposing the peritoneum.  The peritoneum was carefully entered with care being taken to avoid bowel and bladder.  A self-retaining retractor was placed.  The visceral peritoneum was incised in a curvilinear fashion across the lower uterine segment creating a bladder flap. A transverse incision was made across the lower uterine segment and extended laterally and superiorly using the bandage scissors.  Artificial rupture membranes was performed and Clear fluid was noted.  The infant was delivered from the frank breech position by delivery of the feet sweeping the arms across the chest and then performing the Marceau Smelly Vites maneuver.  A nuchal cord was found and reduced.  The infant was immediately bulb suctioned.  The cord was doubly clamped and cut. Cord blood was obtained.  The infant was handed to the pediatrician who then placed the infant under heat  lamps where it was cleaned dried and re-suctioned. The placenta was delivered. The hysterotomy incision was then identified on ring forceps.  The uterine cavity was cleaned with a moist lap sponge.  The hysterotomy incision was closed with a running interlocking suture of Vicryl.  Hemostasis was excellent.  Pitocin was run in the IV and the uterus was found to be firm. The posterior cul-de-sac and gutters were cleaned and inspected.  Hemostasis was noted.  The fascia was then closed with a running suture of #1 Vicryl.  Hemostasis of the subcutaneous tissues was obtained using the Bovie.  The subcutaneous tissues were closed with a running suture of 000 Vicryl.  A subcuticular suture was placed.  Steri-Strips were applied in the usual manner.  A pressure dressing was placed.  The patient went to the recovery room in stable condition.   Elonda Huskyavid J. Alick Lecomte, M.D. 06/08/2016 1:23 PM

## 2016-02-07 ENCOUNTER — Ambulatory Visit (INDEPENDENT_AMBULATORY_CARE_PROVIDER_SITE_OTHER): Payer: BLUE CROSS/BLUE SHIELD

## 2016-02-07 ENCOUNTER — Ambulatory Visit (INDEPENDENT_AMBULATORY_CARE_PROVIDER_SITE_OTHER): Payer: BLUE CROSS/BLUE SHIELD | Admitting: Obstetrics and Gynecology

## 2016-02-07 VITALS — BP 99/62 | HR 91 | Wt 119.3 lb

## 2016-02-07 DIAGNOSIS — Z1379 Encounter for other screening for genetic and chromosomal anomalies: Secondary | ICD-10-CM

## 2016-02-07 DIAGNOSIS — Z369 Encounter for antenatal screening, unspecified: Secondary | ICD-10-CM | POA: Diagnosis not present

## 2016-02-07 DIAGNOSIS — Z3402 Encounter for supervision of normal first pregnancy, second trimester: Secondary | ICD-10-CM

## 2016-02-07 DIAGNOSIS — F17201 Nicotine dependence, unspecified, in remission: Secondary | ICD-10-CM

## 2016-02-07 DIAGNOSIS — O4442 Low lying placenta NOS or without hemorrhage, second trimester: Secondary | ICD-10-CM

## 2016-02-07 DIAGNOSIS — F419 Anxiety disorder, unspecified: Secondary | ICD-10-CM

## 2016-02-07 DIAGNOSIS — B009 Herpesviral infection, unspecified: Secondary | ICD-10-CM

## 2016-02-07 LAB — POCT URINALYSIS DIPSTICK
Bilirubin, UA: NEGATIVE
Glucose, UA: NEGATIVE
Ketones, UA: NEGATIVE
Leukocytes, UA: NEGATIVE
NITRITE UA: NEGATIVE
PH UA: 7.5
Protein, UA: NEGATIVE
Spec Grav, UA: 1.015
Urobilinogen, UA: NEGATIVE

## 2016-02-07 NOTE — Progress Notes (Signed)
ROB: Doing well, no complaints.  Notes headaches have resolved.  S/p anatomy scan, normal female fetus, LLP noted.  Will need repeat scan at 28 weeks. Records reviewed from [facility]. Patient will need repeat pap for h/o LGSIL pap. 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 Positive UDS (marijuana) at [facility] labs. Tobacco cessation noted 10/2015. H/o anxiety disorder, currently denies symptoms.  RTC in 4 weeks.

## 2016-02-14 LAB — AFP, QUAD SCREEN
DIA Mom Value: 1.2
DIA VALUE (EIA): 277.1 pg/mL
DSR (By Age)    1 IN: 878
DSR (Second Trimester) 1 IN: 7039
Gestational Age: 19.9 WEEKS
MSAFP Mom: 0.71
MSAFP: 46 ng/mL
MSHCG MOM: 0.58
MSHCG: 16305 m[IU]/mL
Maternal Age At EDD: 27.7 YEARS
Osb Risk: 10000
TEST RESULTS AFP: NEGATIVE
Weight: 119 [lb_av]
uE3 Mom: 1.28
uE3 Value: 2.55 ng/mL

## 2016-02-15 ENCOUNTER — Telehealth: Payer: Self-pay

## 2016-02-15 NOTE — Telephone Encounter (Signed)
Called pt & informed of negative results.

## 2016-02-15 NOTE — Telephone Encounter (Signed)
-----   Message from Hildred LaserAnika Cherry, MD sent at 02/14/2016  9:32 PM EST ----- Please inform patient of negative 2nd trimester screen.

## 2016-03-07 ENCOUNTER — Ambulatory Visit (INDEPENDENT_AMBULATORY_CARE_PROVIDER_SITE_OTHER): Payer: BLUE CROSS/BLUE SHIELD | Admitting: Obstetrics and Gynecology

## 2016-03-07 VITALS — BP 93/56 | HR 78 | Wt 123.0 lb

## 2016-03-07 DIAGNOSIS — O4440 Low lying placenta NOS or without hemorrhage, unspecified trimester: Secondary | ICD-10-CM

## 2016-03-07 DIAGNOSIS — F419 Anxiety disorder, unspecified: Secondary | ICD-10-CM

## 2016-03-07 DIAGNOSIS — Z3402 Encounter for supervision of normal first pregnancy, second trimester: Secondary | ICD-10-CM

## 2016-03-07 LAB — POCT URINALYSIS DIPSTICK
Bilirubin, UA: NEGATIVE
Blood, UA: NEGATIVE
GLUCOSE UA: NEGATIVE
Ketones, UA: NEGATIVE
Leukocytes, UA: NEGATIVE
Nitrite, UA: NEGATIVE
PROTEIN UA: NEGATIVE
SPEC GRAV UA: 1.01
Urobilinogen, UA: NEGATIVE
pH, UA: 8

## 2016-03-07 NOTE — Progress Notes (Signed)
ROB: Patient notes increase in generalized anxiety. Notes that she has always had a history of anxiety but has just dealt with it.  Discussed that patient may benefit from a referral to a therapist.  Patient states that she works a lot and would probably not have time for continuous sessions.  Does not want medications at this time either. Strongly encouraged patient to have at least 1 counseling session, after which we can determine next steps.  Patient agrees to this, referral placed. RTC in 4 weeks, will also need ultrasound at that time for low-lying placenta and 28 week labs.

## 2016-03-07 NOTE — Progress Notes (Deleted)
ROB

## 2016-03-27 DIAGNOSIS — Z0289 Encounter for other administrative examinations: Secondary | ICD-10-CM

## 2016-03-30 ENCOUNTER — Telehealth: Payer: Self-pay

## 2016-03-30 NOTE — Telephone Encounter (Signed)
Pt calls and states that she has a rash on both breast x 3 days. Pt states that rash is red and itchy, no bumps. Pt notes that she has used an oil recently to decrease stretch marks and this is the only new thing that has been applied to skin. Advised pt to d/s oil immediately. Recommended pt take benadryl nightly for relief of itching, also advised on hydrocortisone cream maximum strength tid. Pt gave verbal understanding. To call back if sx do not improve.

## 2016-04-03 ENCOUNTER — Telehealth: Payer: Self-pay

## 2016-04-03 NOTE — Telephone Encounter (Signed)
Called pt to inform her FMLA paperwork is ready. No answer, unable to leave message as no voicemail is set up.

## 2016-04-04 ENCOUNTER — Other Ambulatory Visit: Payer: BLUE CROSS/BLUE SHIELD

## 2016-04-04 ENCOUNTER — Ambulatory Visit (INDEPENDENT_AMBULATORY_CARE_PROVIDER_SITE_OTHER): Payer: BLUE CROSS/BLUE SHIELD

## 2016-04-04 ENCOUNTER — Ambulatory Visit (INDEPENDENT_AMBULATORY_CARE_PROVIDER_SITE_OTHER): Payer: BLUE CROSS/BLUE SHIELD | Admitting: Obstetrics and Gynecology

## 2016-04-04 VITALS — BP 100/61 | HR 84 | Wt 125.4 lb

## 2016-04-04 DIAGNOSIS — Z131 Encounter for screening for diabetes mellitus: Secondary | ICD-10-CM

## 2016-04-04 DIAGNOSIS — Z23 Encounter for immunization: Secondary | ICD-10-CM

## 2016-04-04 DIAGNOSIS — O4440 Low lying placenta NOS or without hemorrhage, unspecified trimester: Secondary | ICD-10-CM

## 2016-04-04 DIAGNOSIS — Z130 Encounter for screening for diseases of the blood and blood-forming organs and certain disorders involving the immune mechanism: Secondary | ICD-10-CM

## 2016-04-04 DIAGNOSIS — Z3403 Encounter for supervision of normal first pregnancy, third trimester: Secondary | ICD-10-CM | POA: Diagnosis not present

## 2016-04-04 DIAGNOSIS — O4442 Low lying placenta NOS or without hemorrhage, second trimester: Secondary | ICD-10-CM

## 2016-04-04 LAB — POCT URINALYSIS DIPSTICK
BILIRUBIN UA: NEGATIVE
Blood, UA: NEGATIVE
GLUCOSE UA: NEGATIVE
KETONES UA: NEGATIVE
Leukocytes, UA: NEGATIVE
Nitrite, UA: NEGATIVE
PH UA: 8
Protein, UA: NEGATIVE
Spec Grav, UA: 1.01
Urobilinogen, UA: NEGATIVE

## 2016-04-04 MED ORDER — TETANUS-DIPHTH-ACELL PERTUSSIS 5-2.5-18.5 LF-MCG/0.5 IM SUSP
0.5000 mL | Freq: Once | INTRAMUSCULAR | Status: AC
  Administered 2016-04-04: 0.5 mL via INTRAMUSCULAR

## 2016-04-04 NOTE — Progress Notes (Signed)
ROB: Doing well, no complaints. For 28 week labs today.  Desires to breastfeeding, desires Depo Provera for contraception. For Tdap today, signed blood consent, discussed cord blood banking. Ultrasound done today for LLP, now resolved.  RTC in 2 weeks.

## 2016-04-05 ENCOUNTER — Telehealth: Payer: Self-pay

## 2016-04-05 LAB — HEMOGLOBIN AND HEMATOCRIT, BLOOD
HEMOGLOBIN: 12.3 g/dL (ref 11.1–15.9)
Hematocrit: 36.6 % (ref 34.0–46.6)

## 2016-04-05 LAB — GLUCOSE, 1 HOUR GESTATIONAL: Gestational Diabetes Screen: 75 mg/dL (ref 65–139)

## 2016-04-05 NOTE — Telephone Encounter (Signed)
Called pt, no answer. Unable to leave message as no voicemail is set up.  

## 2016-04-05 NOTE — Telephone Encounter (Signed)
-----   Message from Hildred LaserAnika Cherry, MD sent at 04/05/2016  8:34 AM EST ----- Please inform of normal Hgb and GDM screen.

## 2016-04-05 NOTE — Telephone Encounter (Signed)
Pt returns call informed her of normal results

## 2016-04-19 ENCOUNTER — Ambulatory Visit (INDEPENDENT_AMBULATORY_CARE_PROVIDER_SITE_OTHER): Payer: BLUE CROSS/BLUE SHIELD | Admitting: Obstetrics and Gynecology

## 2016-04-19 VITALS — BP 99/61 | HR 85 | Wt 128.3 lb

## 2016-04-19 DIAGNOSIS — Z3403 Encounter for supervision of normal first pregnancy, third trimester: Secondary | ICD-10-CM

## 2016-04-19 LAB — POCT URINALYSIS DIPSTICK
BILIRUBIN UA: NEGATIVE
Blood, UA: NEGATIVE
Glucose, UA: NEGATIVE
KETONES UA: NEGATIVE
LEUKOCYTES UA: NEGATIVE
Nitrite, UA: NEGATIVE
Protein, UA: NEGATIVE
SPEC GRAV UA: 1.01 (ref 1.030–1.035)
UROBILINOGEN UA: NEGATIVE (ref ?–2.0)
pH, UA: 6.5 (ref 5.0–8.0)

## 2016-04-19 NOTE — Progress Notes (Signed)
ROB: Doing well, no complaints. Desires letter for work to reduce excessive physical activity. Letter provided.  RTC in 2 weeks.

## 2016-05-02 ENCOUNTER — Ambulatory Visit (INDEPENDENT_AMBULATORY_CARE_PROVIDER_SITE_OTHER): Payer: BLUE CROSS/BLUE SHIELD | Admitting: Obstetrics and Gynecology

## 2016-05-02 ENCOUNTER — Encounter: Payer: Self-pay | Admitting: Obstetrics and Gynecology

## 2016-05-02 VITALS — BP 93/63 | HR 88 | Wt 130.1 lb

## 2016-05-02 DIAGNOSIS — Z3493 Encounter for supervision of normal pregnancy, unspecified, third trimester: Secondary | ICD-10-CM

## 2016-05-02 DIAGNOSIS — L258 Unspecified contact dermatitis due to other agents: Secondary | ICD-10-CM

## 2016-05-02 LAB — POCT URINALYSIS DIPSTICK
BILIRUBIN UA: NEGATIVE
Glucose, UA: NEGATIVE
KETONES UA: NEGATIVE
LEUKOCYTES UA: NEGATIVE
NITRITE UA: NEGATIVE
PH UA: 7.5 (ref 5.0–8.0)
PROTEIN UA: NEGATIVE
RBC UA: NEGATIVE
Spec Grav, UA: 1.01 (ref 1.030–1.035)
Urobilinogen, UA: NEGATIVE (ref ?–2.0)

## 2016-05-02 MED ORDER — HYDROXYZINE HCL 25 MG PO TABS: 25.0000 mg | ORAL_TABLET | Freq: Four times a day (QID) | ORAL | 0 refills | Status: DC | PRN

## 2016-05-02 NOTE — Progress Notes (Signed)
HPI:      [name] is a 26 y.o. G1P0000 who LMP was No LMP recorded (within months). Patient is pregnant.  Subjective:   She presents today Complaining of a rash on both breasts. She states that it itches. This is not the first time this has occurred during this pregnancy. She previously took Benadryl and used hydrocortisone cream and it resolved. She states the itching is worse this time and she can't work while taking the Benadryl.    Hx: The following portions of the patient's history were reviewed and updated as appropriate:             She  does not have any pertinent problems on file. She has a current medication list which includes the following prescription(s): loratadine, multivitamin-prenatal, sodium chloride, butalbital-apap-caffeine, and hydroxyzine.       Review of Systems:  Review of Systems  Constitutional: Denied constitutional symptoms, night sweats, recent illness, fatigue, fever, insomnia and weight loss.  Eyes: Denied eye symptoms, eye pain, photophobia, vision change and visual disturbance.  Ears/Nose/Throat/Neck: Denied ear, nose, throat or neck symptoms, hearing loss, nasal discharge, sinus congestion and sore throat.  Cardiovascular: Denied cardiovascular symptoms, arrhythmia, chest pain/pressure, edema, exercise intolerance, orthopnea and palpitations.  Respiratory: Denied pulmonary symptoms, asthma, pleuritic pain, productive sputum, cough, dyspnea and wheezing.  Gastrointestinal: Denied, gastro-esophageal reflux, melena, nausea and vomiting.  Genitourinary: Denied genitourinary symptoms including symptomatic vaginal discharge, pelvic relaxation issues, and urinary complaints.  Musculoskeletal: Denied musculoskeletal symptoms, stiffness, swelling, muscle weakness and myalgia.  Dermatologic: See HPI for additional information.  Neurologic: Denied neurology symptoms, dizziness, headache, neck pain and syncope.  Psychiatric: Denied psychiatric symptoms,  anxiety and depression.  Endocrine: Denied endocrine symptoms including hot flashes and night sweats.   Meds:   Current Outpatient Prescriptions on File Prior to Visit  Medication Sig Dispense Refill  . loratadine (CLARITIN) 10 MG tablet Take 10 mg by mouth daily.    . Prenatal Vit-Fe Fumarate-FA (MULTIVITAMIN-PRENATAL) 27-0.8 MG TABS tablet Take 1 tablet by mouth daily at 12 noon.    . sodium chloride (OCEAN) 0.65 % SOLN nasal spray Place 1 spray into both nostrils as needed for congestion.    . Butalbital-APAP-Caffeine 50-325-40 MG capsule Take 1-2 capsules by mouth every 6 (six) hours as needed for headache. (Patient not taking: Reported on 05/02/2016) 30 capsule 3   No current facility-administered medications on file prior to visit.     Objective:     Vitals:   05/02/16 1051  BP: 93/63  Pulse: 88              Breast exam reveals no masses-there is a erythematous rash on both breasts.  Assessment:    G1P0000 Patient Active Problem List   Diagnosis Date Noted  . Low lying placenta nos or without hemorrhage, second trimester 02/07/2016  . Tobacco use disorder, moderate, in early remission 02/07/2016  . 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 Anxiety disorder 02/07/2016  . Marijuana use, episodic 01/08/2016  . 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 Prenatal care in third trimester   2. Contact dermatitis due to other agent, unspecified contact dermatitis type     The rash is consistent with a contact dermatitis.  We discussed soaps and different things, but it seems that she has a new bra but she has not washed and has been wearing. This is the likely source. Advised her to  wash the bra before wearing.   Plan:            1.  Atarax for itching-short course. Orders Orders Placed This Encounter  Procedures  . POCT urinalysis dipstick     Meds ordered this encounter  Medications  .  hydrOXYzine (ATARAX/VISTARIL) 25 MG tablet    Sig: Take 1 tablet (25 mg total) by mouth every 6 (six) hours as needed for itching.    Dispense:  20 tablet    Refill:  0        F/U  Return for As scheduled.  Elonda Huskyavid J. Wylie Coon, M.D. 05/02/2016 11:49 AM

## 2016-05-08 ENCOUNTER — Ambulatory Visit (INDEPENDENT_AMBULATORY_CARE_PROVIDER_SITE_OTHER): Payer: BLUE CROSS/BLUE SHIELD | Admitting: Obstetrics and Gynecology

## 2016-05-08 ENCOUNTER — Encounter: Payer: Self-pay | Admitting: Obstetrics and Gynecology

## 2016-05-08 VITALS — BP 116/70 | HR 93 | Wt 133.4 lb

## 2016-05-08 DIAGNOSIS — Z3493 Encounter for supervision of normal pregnancy, unspecified, third trimester: Secondary | ICD-10-CM

## 2016-05-08 LAB — POCT URINALYSIS DIPSTICK
Bilirubin, UA: NEGATIVE
Blood, UA: NEGATIVE
GLUCOSE UA: NEGATIVE
KETONES UA: NEGATIVE
LEUKOCYTES UA: NEGATIVE
Nitrite, UA: NEGATIVE
Spec Grav, UA: 1.02 (ref 1.030–1.035)
Urobilinogen, UA: NEGATIVE (ref ?–2.0)
pH, UA: 8 (ref 5.0–8.0)

## 2016-05-08 NOTE — Addendum Note (Signed)
Addended by: Brooke Dare on: 05/08/2016 09:54 AM   Modules accepted: Orders

## 2016-05-08 NOTE — Addendum Note (Signed)
Addended by: Brooke Dare on: 05/08/2016 09:37 AM   Modules accepted: Orders

## 2016-05-08 NOTE — Progress Notes (Signed)
ROB: Rash resolved.  Size< dates - Breech = U/S ordered.

## 2016-05-10 LAB — URINE CULTURE

## 2016-05-17 ENCOUNTER — Ambulatory Visit (INDEPENDENT_AMBULATORY_CARE_PROVIDER_SITE_OTHER): Payer: BLUE CROSS/BLUE SHIELD | Admitting: Obstetrics and Gynecology

## 2016-05-17 ENCOUNTER — Other Ambulatory Visit: Payer: BLUE CROSS/BLUE SHIELD

## 2016-05-17 ENCOUNTER — Other Ambulatory Visit: Payer: Self-pay | Admitting: Obstetrics and Gynecology

## 2016-05-17 ENCOUNTER — Other Ambulatory Visit: Payer: Self-pay | Admitting: *Deleted

## 2016-05-17 ENCOUNTER — Ambulatory Visit (INDEPENDENT_AMBULATORY_CARE_PROVIDER_SITE_OTHER): Payer: BLUE CROSS/BLUE SHIELD

## 2016-05-17 ENCOUNTER — Telehealth: Payer: Self-pay | Admitting: Obstetrics and Gynecology

## 2016-05-17 ENCOUNTER — Encounter: Payer: Self-pay | Admitting: Obstetrics and Gynecology

## 2016-05-17 VITALS — BP 98/60 | HR 88 | Wt 134.4 lb

## 2016-05-17 DIAGNOSIS — Z3493 Encounter for supervision of normal pregnancy, unspecified, third trimester: Secondary | ICD-10-CM

## 2016-05-17 DIAGNOSIS — O365931 Maternal care for other known or suspected poor fetal growth, third trimester, fetus 1: Secondary | ICD-10-CM

## 2016-05-17 DIAGNOSIS — O365930 Maternal care for other known or suspected poor fetal growth, third trimester, not applicable or unspecified: Secondary | ICD-10-CM

## 2016-05-17 DIAGNOSIS — O26843 Uterine size-date discrepancy, third trimester: Secondary | ICD-10-CM

## 2016-05-17 NOTE — Progress Notes (Signed)
Patient seen today after ultrasound revealing 8th percentile for growth 5th percentile for amniotic fluid index. Umbilical artery Dopplers normal. Have initiated referral to [facility] perinatology for follow-up on Monday. Kick counts discussed with patient. We will begin twice weekly fetal surveillance including NST and biophysical profiles with AFI and umbilical artery Dopplers. Consider delivery at 37 weeks for growth less than 5 percentile after 39 weeks for growth above 5th percentile but less than 10. Rescan for growth at 37 weeks.

## 2016-05-17 NOTE — Telephone Encounter (Signed)
Appointment scheduled with Victorino Dike @ [facility] for Monday, April 16th @ 8am   Appointment information given to patient

## 2016-05-21 ENCOUNTER — Observation Stay
Admission: RE | Admit: 2016-05-21 | Discharge: 2016-05-21 | Disposition: A | Discharge status: Home / Self Care | Payer: BLUE CROSS/BLUE SHIELD | Attending: Obstetrics and Gynecology | Admitting: Obstetrics and Gynecology

## 2016-05-21 ENCOUNTER — Ambulatory Visit
Admission: RE | Admit: 2016-05-21 | Discharge: 2016-05-21 | Disposition: A | Discharge status: Home / Self Care | Payer: BLUE CROSS/BLUE SHIELD | Source: Ambulatory Visit | Attending: Obstetrics and Gynecology | Admitting: Obstetrics and Gynecology

## 2016-05-21 ENCOUNTER — Other Ambulatory Visit: Payer: Self-pay | Admitting: Obstetrics and Gynecology

## 2016-05-21 ENCOUNTER — Other Ambulatory Visit: Payer: Self-pay | Admitting: *Deleted

## 2016-05-21 DIAGNOSIS — O365930 Maternal care for other known or suspected poor fetal growth, third trimester, not applicable or unspecified: Secondary | ICD-10-CM

## 2016-05-21 DIAGNOSIS — Z3A34 34 weeks gestation of pregnancy: Secondary | ICD-10-CM | POA: Insufficient documentation

## 2016-05-21 DIAGNOSIS — O365990 Maternal care for other known or suspected poor fetal growth, unspecified trimester, not applicable or unspecified: Secondary | ICD-10-CM | POA: Diagnosis present

## 2016-05-21 DIAGNOSIS — O26893 Other specified pregnancy related conditions, third trimester: Principal | ICD-10-CM | POA: Insufficient documentation

## 2016-05-21 MED ORDER — ACETAMINOPHEN 325 MG PO TABS: 650.0000 mg | ORAL_TABLET | ORAL | Status: DC | PRN

## 2016-05-21 MED ORDER — CALCIUM CARBONATE ANTACID 500 MG PO CHEW: 2.0000 | CHEWABLE_TABLET | ORAL | Status: DC | PRN

## 2016-05-21 NOTE — OB Triage Note (Signed)
Darlene Miller sent over from [facility], low AFI, 3rd% growth, possibly LOF, pt reports having leaking around 1 time per day, clear, no odor, not enough to soak a pantyliner, reports positve fetal movement, denies bleeding.  Baby breech per [facility] visit today.

## 2016-05-21 NOTE — Discharge Instructions (Signed)
Early Elective Birth Introduction Early elective birth refers to making a choice to have a baby before the time the baby is due. The length of a pregnancy is 9 months, or 40 weeks, starting from the beginning of a woman's last menstrual period. Most women naturally go into labor around 40 weeks of gestation. A full-term pregnancy is considered between 37 weeks and 42 weeks of gestation. Currently, early elective births can take place sometime after 39 weeks of gestation. Most health care providers practice within the guidelines of delivering a baby no later than 42 weeks of gestation and no earlier than 39 weeks of gestation. There are exceptions to this time interval, and the risks involved to the mother and baby need to be considered in those cases. Induction of labor refers to the use of medicines to bring aboutcontractions. Labor is when the cervix starts to widen (dilate). Active labor is when there are contractions and the cervix has dilated to at least 4 cm. Oftentimes, the earlier a mother is in her pregnancy, the longer it takes to get induced. When the cervix is ready (dilated and soft), an induction may take less than a day. However, when a cervix is far away from being ready (long, closed, and firm), it may take days in a hospital for labor to start. Currently, 39 weeks of gestation is considered the earliest a health care providershould start the induction process. This is because the longer the baby stays inside the uterus, the lower the risks are to both the baby and mother. However, sometimes there are very good reasons for a pregnancy to be induced before 39 weeks of gestation. These exceptions are specific to each individual pregnancy and need to be considered on a case-by-case basis. A good reason to induce one pregnancy may not be a good reason to induce another another pregnancy. REASONS FOR ELECTIVE BIRTH It may be safer to induce labor before 39 weeks of gestation if:  A woman is  carrying more than 1 baby. Current standards are to deliver twin pregnancies at 38 weeks of gestation.  A woman is having complications, such as:  High blood pressure caused by pregnancy (preeclampsia).  Bleeding.  Infection.  There are conditions affecting the baby's health, such as:  Intrauterine growth restriction (IUGR), where the baby is not growing well.  Having abnormal fetal heart rate patterns on the monitor (nonreassuring tracing).  Having a lack of fluid that surrounds the baby (oligohydramnios).  Having issues with the placenta.  Fluid that surrounds the baby (amniotic fluid) is leaking. There are many other safety reasons that a pregnancy may need to be induced early. REASONS AGAINST ELECTIVE BIRTH Sometimes early elective birth is not the best choice. It may not be a good idea if:  An early birth is chosen just for convenience.  You want the baby to be born on a certain date, like a holiday.  You are more likely to need a cesarean delivery before 39 weeks of gestation. A cesarean delivery can lead to other problems. Problems include infection, bleeding, and not having enough iron in your blood (anemia), which can cause weakness. Babies born early (34-37 weeks of gestation):  May need special care at the hospital or in a special care nursery.  Are at a greater risk for:  Infection.  Brain damage or bleeding inside the brain.  Dying during their first year of life.  Feeding problems.  Breathing problems.  Slow physical and mental development.  May need  special care in a neonatal intensive care unit (NICU), but this is rare. The length of the baby's stay in the hospital will depend on how quickly he or she progresses to a safe level of care. REDUCING EARLY ELECTIVE BIRTHS Carrying a baby longer than 42 weeks of gestation is not good for the baby or the mother. A full-term pregnancy is best for baby and mother. Anything earlier can be risky for you and  your baby. Remember:  An early elective birth may lead to a cesarean delivery. This can lead to other problems for the mother and baby.  An early elective birth can result in developmental problems for your child.  A baby's brain continues to develop while in the uterus.  A baby's body continues to develop. The baby will be better able to breathe and eat when he or she is born near the due date.  A baby who stays in the uterus longer responds better. The baby will also bond better with you. This information is not intended to replace advice given to you by your health care provider. Make sure you discuss any questions you have with your health care provider. Document Released: 10/04/2010 Document Revised: 06/30/2015 Document Reviewed: 08/21/2012 Elsevier Interactive Patient Education  2017 Else[employer] Inc[employer] Fetal Kick Counts twice daily   Labor Induction Labor induction is when steps are taken to cause a pregnant woman to begin the labor process. Most women go into labor on their own between 37 weeks and 42 weeks of the pregnancy. When this does not happen or when there is a medical need, methods may be used to induce labor. Labor induction causes a pregnant woman's uterus to contract. It also causes the cervix to soften (ripen), open (dilate), and thin out (efface). Usually, labor is not induced before 39 weeks of the pregnancy unless there is a problem with the baby or mother. Before inducing labor, your health care provider will consider a number of factors, including the following:  The medical condition of you and the baby.  How many weeks along you are.  The status of the babys lung maturity.  The condition of the cervix.  The position of the baby. What are the reasons for labor induction? Labor may be induced for the following reasons:  The health of the baby or mother is at risk.  The pregnancy is overdue by 1 week or more.  The water breaks but labor does not start on its  own.  The mother has a health condition or serious illness, such as high blood pressure, infection, placental abruption, or diabetes.  The amniotic fluid amounts are low around the baby.  The baby is distressed. Convenience or wanting the baby to be born on a certain date is not a reason for inducing labor. What methods are used for labor induction? Several methods of labor induction may be used, such as:  Prostaglandin medicine. This medicine causes the cervix to dilate and ripen. The medicine will also start contractions. It can be taken by mouth or by inserting a suppository into the vagina.  Inserting a thin tube (catheter) with a balloon on the end into the vagina to dilate the cervix. Once inserted, the balloon is expanded with water, which causes the cervix to open.  Stripping the membranes. Your health care provider separates amniotic sac tissue from the cervix, causing the cervix to be stretched and causing the release of a hormone called progesterone. This may cause the uterus to contract. It is  often done during an office visit. You will be sent home to wait for the contractions to begin. You will then come in for an induction.  Breaking the water. Your health care provider makes a hole in the amniotic sac using a small instrument. Once the amniotic sac breaks, contractions should begin. This may still take hours to see an effect.  Medicine to trigger or strengthen contractions. This medicine is given through an IV access tube inserted into a vein in your arm. All of the methods of induction, besides stripping the membranes, will be done in the hospital. Induction is done in the hospital so that you and the baby can be carefully monitored. How long does it take for labor to be induced? Some inductions can take up to 2-3 days. Depending on the cervix, it usually takes less time. It takes longer when you are induced early in the pregnancy or if this is your first pregnancy. If a mother  is still pregnant and the induction has been going on for 2-3 days, either the mother will be sent home or a cesarean delivery will be needed. What are the risks associated with labor induction? Some of the risks of induction include:  Changes in fetal heart rate, such as too high, too low, or erratic.  Fetal distress.  Chance of infection for the mother and baby.  Increased chance of having a cesarean delivery.  Breaking off (abruption) of the placenta from the uterus (rare).  Uterine rupture (very rare). When induction is needed for medical reasons, the benefits of induction may outweigh the risks. What are some reasons for not inducing labor? Labor induction should not be done if:  It is shown that your baby does not tolerate labor.  You have had previous surgeries on your uterus, such as a myomectomy or the removal of fibroids.  Your placenta lies very low in the uterus and blocks the opening of the cervix (placenta previa).  Your baby is not in a head-down position.  The umbilical cord drops down into the birth canal in front of the baby. This could cut off the baby's blood and oxygen supply.  You have had a previous cesarean delivery.  There are unusual circumstances, such as the baby being extremely premature. This information is not intended to replace advice given to you by your health care provider. Make sure you discuss any questions you have with your health care provider. Document Released: 06/13/2006 Document Revised: 06/30/2015 Document Reviewed: 08/21/2012 Elsevier Interactive Patient Education  2017 [employer].   Labor Induction Labor induction is when steps are taken to cause a pregnant woman to begin the labor process. Most women go into labor on their own between 37 weeks and 42 weeks of the pregnancy. When this does not happen or when there is a medical need, methods may be used to induce labor. Labor induction causes a pregnant woman's uterus to  contract. It also causes the cervix to soften (ripen), open (dilate), and thin out (efface). Usually, labor is not induced before 39 weeks of the pregnancy unless there is a problem with the baby or mother. Before inducing labor, your health care provider will consider a number of factors, including the following:  The medical condition of you and the baby.  How many weeks along you are.  The status of the babys lung maturity.  The condition of the cervix.  The position of the baby. What are the reasons for labor induction? Labor may be induced  for the following reasons:  The health of the baby or mother is at risk.  The pregnancy is overdue by 1 week or more.  The water breaks but labor does not start on its own.  The mother has a health condition or serious illness, such as high blood pressure, infection, placental abruption, or diabetes.  The amniotic fluid amounts are low around the baby.  The baby is distressed. Convenience or wanting the baby to be born on a certain date is not a reason for inducing labor. What methods are used for labor induction? Several methods of labor induction may be used, such as:  Prostaglandin medicine. This medicine causes the cervix to dilate and ripen. The medicine will also start contractions. It can be taken by mouth or by inserting a suppository into the vagina.  Inserting a thin tube (catheter) with a balloon on the end into the vagina to dilate the cervix. Once inserted, the balloon is expanded with water, which causes the cervix to open.  Stripping the membranes. Your health care provider separates amniotic sac tissue from the cervix, causing the cervix to be stretched and causing the release of a hormone called progesterone. This may cause the uterus to contract. It is often done during an office visit. You will be sent home to wait for the contractions to begin. You will then come in for an induction.  Breaking the water. Your health  care provider makes a hole in the amniotic sac using a small instrument. Once the amniotic sac breaks, contractions should begin. This may still take hours to see an effect.  Medicine to trigger or strengthen contractions. This medicine is given through an IV access tube inserted into a vein in your arm. All of the methods of induction, besides stripping the membranes, will be done in the hospital. Induction is done in the hospital so that you and the baby can be carefully monitored. How long does it take for labor to be induced? Some inductions can take up to 2-3 days. Depending on the cervix, it usually takes less time. It takes longer when you are induced early in the pregnancy or if this is your first pregnancy. If a mother is still pregnant and the induction has been going on for 2-3 days, either the mother will be sent home or a cesarean delivery will be needed. What are the risks associated with labor induction? Some of the risks of induction include:  Changes in fetal heart rate, such as too high, too low, or erratic.  Fetal distress.  Chance of infection for the mother and baby.  Increased chance of having a cesarean delivery.  Breaking off (abruption) of the placenta from the uterus (rare).  Uterine rupture (very rare). When induction is needed for medical reasons, the benefits of induction may outweigh the risks. What are some reasons for not inducing labor? Labor induction should not be done if:  It is shown that your baby does not tolerate labor.  You have had previous surgeries on your uterus, such as a myomectomy or the removal of fibroids.  Your placenta lies very low in the uterus and blocks the opening of the cervix (placenta previa).  Your baby is not in a head-down position.  The umbilical cord drops down into the birth canal in front of the baby. This could cut off the baby's blood and oxygen supply.  You have had a previous cesarean delivery.  There are  unusual circumstances, such as the baby being  extremely premature. This information is not intended to replace advice given to you by your health care provider. Make sure you discuss any questions you have with your health care provider. Document Released: 06/13/2006 Document Revised: 06/30/2015 Document Reviewed: 08/21/2012 Elsevier Interactive Patient Education  2017 Elsevier Inc.  Intrauterine Growth Restriction Intrauterine growth restriction (IUGR) is when your baby is not growing normally during your pregnancy. A baby with IUGR is smaller than it should be and may weigh less than normal at birth. IUGR can result if there is a problem with the organ that supplies your baby with oxygen and nutrition (placenta). Usually, there is no way to prevent this type of problem. What are the causes? The most common cause of IUGR is a problem with the placenta or umbilical cord that causes your developing baby to get less oxygen or nutrition than needed. Other causes include:  Poor maternal nutrition.  Chemicals found in substances such as cigarettes, alcohol, and some illegal drugs.  Some prescription medicines.  Congenital defects.  Genetic disorders.  An infection.  Carrying more than one baby, such as twins or triplets (multiple gestations). What increases the risk? This condition is more likely to develop in women who:  Are over the age of 68 at the time of delivery (advanced maternal age).  Have medical conditions such as high blood pressure, diabetes, heart or kidney disease, anemia, or conditions that increase the risk for blood clotting.  Live at a very high altitude during pregnancy.  Have a personal history or family history of IUGR.  Take medicines during pregnancy that are linked with congenital disabilities.  Have a personal or family history of a genetic disorder.  Come into contact with infected cat feces (toxoplasmosis).  Come into contact with chickenpox  (varicella) or [place] measles (rubella).  Have or are at risk of getting an infectious disease such as syphilis, HIV, or herpes.  Eat poorly during their pregnancy.  Weigh less than 100 pounds.  Have a family history of multiple gestations.  Have had infertility treatments.  Use tobacco, illegal drugs, or drink alcohol during pregnancy. What are the signs or symptoms? IUGR does not cause many symptoms. You might notice that your baby does not move or kick very often. Also, your belly may not be as big as expected for the stage of your pregnancy. How is this diagnosed? This condition is diagnosed with physical and prenatal exams. Your health care provider will measure the size of your baby inside your womb during a routine screening exam using sound waves (ultrasound). Your health care provider will compare the size of your baby to the size of other babies at the same stage of development (gestational age). Your health care provider will diagnose IUGR if your baby is smaller than 90 percent of all other babies at the same gestational age. You may also have tests to find the cause of IUGR. These can include:  Having fluid removed from your womb to check for signs of infection or a congenital disability (amniocentesis).  A series of tests to monitor your baby's health (fetal monitoring). How is this treated? In most cases, treatment for this condition focuses on stopping the cause of your babys small growth. Your health care providers will monitor your pregnancy closely and help you manage your pregnancy. If this condition is caused by a placenta problem and the baby is not getting enough blood, treatment may include:  Medicine to start labor and deliver your baby early (induction).  Cesarean  delivery. In this procedure, your baby is delivered through a cut (incision) in the abdomen and womb (uterus). Follow these instructions at home:  Make sure you are eating enough calories and gaining  enough weight.  Eat a balanced diet. Work with a Dealer (dietitian), if needed.  Rest as needed. Try to get at least eight hours of sleep every night.  Do not drink alcohol.  Do not use illegal drugs.  Do not use any tobacco products, including cigarettes, chewing tobacco, or e-cigarettes. If you need help quitting, ask your health care provider.  Talk to your health care provider about steps you can take to avoid infections.  Take medicines, vitamins, and mineral supplements only as directed by your health care provider. Make sure that your health care provider knows about all of the prescribed or over-the-counter medicines, supplements, vitamins, eye drops, and creams that you are using.  Keep all follow-up visits as directed by your health care provider. This is important. Contact a health care provider if:  Your baby is not moving as often as before. This information is not intended to replace advice given to you by your health care provider. Make sure you discuss any questions you have with your health care provider. Document Released: 11/01/2007 Document Revised: 06/30/2015 Document Reviewed: 01/18/2014 Elsevier Interactive Patient Education  2017 [employer]. Return for Rupture of membranes, decreased FM

## 2016-05-21 NOTE — Final Progress Note (Signed)
Physician Final Progress Note  Patient ID: Darlene Miller MRN: [id number] DOB/AGE: Jan 17, 1991 26 y.o.  Admit date: 05/21/2016 Admitting provider: Herold Harms, MD Discharge date: 05/21/2016   Admission Diagnoses:  1. IUGR 2. Low AFI 3. Leaking fluid  Discharge Diagnoses:  Active Problems:   * No active hospital problems. * No ROM 34.5 week IUP with IUGR/Low AFI Consults: None  Significant Findings/ Diagnostic Studies: BPP today 8/8 at [facility]; Sterile spec exam: Long/ Closed/milky secretions - Nitrazine NEGATIVE; NST Reactive  Procedures: Sterile Speculum with nitrazine  Discharge Condition: good  Disposition: 01-Home or Self Care  Diet: Regular diet  Discharge Activity: Activity as tolerated.  Discharge Instructions    Discharge patient    Complete by:  As directed    Discharge disposition:  01-Home or Self Care   Discharge patient date:  05/21/2016     Allergies as of 05/21/2016   No Known Allergies     Medication List    TAKE these medications   Butalbital-APAP-Caffeine 50-325-40 MG capsule Take 1-2 capsules by mouth every 6 (six) hours as needed for headache.   CLARITIN 10 MG tablet Generic drug:  loratadine Take 10 mg by mouth daily.   hydrOXYzine 25 MG tablet Commonly known as:  ATARAX/VISTARIL Take 1 tablet (25 mg total) by mouth every 6 (six) hours as needed for itching.   multivitamin-prenatal 27-0.8 MG Tabs tablet Take 1 tablet by mouth daily at 12 noon.   sodium chloride 0.65 % Soln nasal spray Commonly known as:  OCEAN Place 1 spray into both nostrils as needed for congestion.      Follow-up Information    Hildred Laser, MD Follow up.   Specialties:  Obstetrics and Gynecology, Radiology Why:  NST Contact information: [address] [place] [address] [phone]          Signed: Prentice Docker Randie Bloodgood 05/21/2016, 1:06 PM

## 2016-05-22 ENCOUNTER — Encounter: Payer: BLUE CROSS/BLUE SHIELD | Admitting: Obstetrics and Gynecology

## 2016-05-24 ENCOUNTER — Other Ambulatory Visit: Payer: Self-pay | Admitting: Obstetrics and Gynecology

## 2016-05-24 ENCOUNTER — Ambulatory Visit (INDEPENDENT_AMBULATORY_CARE_PROVIDER_SITE_OTHER): Payer: BLUE CROSS/BLUE SHIELD | Admitting: Obstetrics and Gynecology

## 2016-05-24 ENCOUNTER — Other Ambulatory Visit: Payer: BLUE CROSS/BLUE SHIELD

## 2016-05-24 VITALS — BP 102/66 | HR 77 | Wt 136.7 lb

## 2016-05-24 DIAGNOSIS — Z3689 Encounter for other specified antenatal screening: Secondary | ICD-10-CM

## 2016-05-24 DIAGNOSIS — Z3A35 35 weeks gestation of pregnancy: Secondary | ICD-10-CM

## 2016-05-24 DIAGNOSIS — Z113 Encounter for screening for infections with a predominantly sexual mode of transmission: Secondary | ICD-10-CM

## 2016-05-24 DIAGNOSIS — O365930 Maternal care for other known or suspected poor fetal growth, third trimester, not applicable or unspecified: Secondary | ICD-10-CM | POA: Diagnosis not present

## 2016-05-24 DIAGNOSIS — Z3685 Encounter for antenatal screening for Streptococcus B: Secondary | ICD-10-CM

## 2016-05-24 NOTE — Progress Notes (Signed)
NONSTRESS TEST INTERPRETATION  INDICATIONS: fetus small for gestational age  RESULTS:  reactive COMMENTS: no contractions  PLAN: 1. Continue fetal kick counts as directed. 2. Continue antepartum testing as scheduled.  [name], M.D. 05/24/2016 11:21 AM

## 2016-05-24 NOTE — Progress Notes (Signed)
ROB:  3% growth at [facility].  Weekly UA dopplers at [facility] and weekly NSTs here.  Kick counts.  Plan repeat growth May 6th.  Above 5% continue to 39 wks.  Below 5% - plan delivery at 37 wks.  Discussed with pt. GC/CT GBS done.

## 2016-05-27 LAB — GC/CHLAMYDIA PROBE AMP
CHLAMYDIA, DNA PROBE: NEGATIVE
Neisseria gonorrhoeae by PCR: NEGATIVE

## 2016-05-28 ENCOUNTER — Ambulatory Visit
Admission: RE | Admit: 2016-05-28 | Discharge: 2016-05-28 | Disposition: A | Discharge status: Home / Self Care | Payer: BLUE CROSS/BLUE SHIELD | Source: Ambulatory Visit | Attending: Maternal and Fetal Medicine | Admitting: Maternal and Fetal Medicine

## 2016-05-28 ENCOUNTER — Ambulatory Visit (HOSPITAL_BASED_OUTPATIENT_CLINIC_OR_DEPARTMENT_OTHER)
Admission: RE | Admit: 2016-05-28 | Discharge: 2016-05-28 | Disposition: A | Discharge status: Home / Self Care | Payer: BLUE CROSS/BLUE SHIELD | Source: Ambulatory Visit | Attending: Maternal and Fetal Medicine | Admitting: Maternal and Fetal Medicine

## 2016-05-28 DIAGNOSIS — O09893 Supervision of other high risk pregnancies, third trimester: Secondary | ICD-10-CM | POA: Insufficient documentation

## 2016-05-28 DIAGNOSIS — O365930 Maternal care for other known or suspected poor fetal growth, third trimester, not applicable or unspecified: Secondary | ICD-10-CM | POA: Insufficient documentation

## 2016-05-28 DIAGNOSIS — Z3A35 35 weeks gestation of pregnancy: Secondary | ICD-10-CM | POA: Diagnosis not present

## 2016-05-28 LAB — CULTURE, BETA STREP (GROUP B ONLY): STREP GP B CULTURE: NEGATIVE

## 2016-05-28 NOTE — Progress Notes (Signed)
NST reviewed by me. I agree with nursing documentation in flow chart.  Reactive/AGA NST today.  Continue plan as outlined.  Artemio Aly, MD

## 2016-05-31 ENCOUNTER — Encounter: Payer: Self-pay | Admitting: Obstetrics and Gynecology

## 2016-05-31 ENCOUNTER — Ambulatory Visit (INDEPENDENT_AMBULATORY_CARE_PROVIDER_SITE_OTHER): Payer: BLUE CROSS/BLUE SHIELD | Admitting: Obstetrics and Gynecology

## 2016-05-31 VITALS — BP 104/69 | HR 94 | Wt 139.0 lb

## 2016-05-31 DIAGNOSIS — Z3493 Encounter for supervision of normal pregnancy, unspecified, third trimester: Secondary | ICD-10-CM

## 2016-05-31 DIAGNOSIS — B009 Herpesviral infection, unspecified: Secondary | ICD-10-CM

## 2016-05-31 DIAGNOSIS — O98513 Other viral diseases complicating pregnancy, third trimester: Secondary | ICD-10-CM

## 2016-05-31 LAB — POCT URINALYSIS DIPSTICK
BILIRUBIN UA: NEGATIVE
Bilirubin, UA: NEGATIVE
Blood, UA: NEGATIVE
Blood, UA: NEGATIVE
Glucose, UA: NEGATIVE
Glucose, UA: NEGATIVE
KETONES UA: NEGATIVE
Ketones, UA: NEGATIVE
LEUKOCYTES UA: NEGATIVE
LEUKOCYTES UA: NEGATIVE
NITRITE UA: NEGATIVE
Nitrite, UA: NEGATIVE
PH UA: 7.5 (ref 5.0–8.0)
PROTEIN UA: NEGATIVE
PROTEIN UA: NEGATIVE
Spec Grav, UA: 1.01 (ref 1.010–1.025)
Spec Grav, UA: 1.02 (ref 1.010–1.025)
Urobilinogen, UA: NEGATIVE E.U./dL — AB
Urobilinogen, UA: NEGATIVE E.U./dL — AB
pH, UA: 5 (ref 5.0–8.0)

## 2016-05-31 MED ORDER — VALACYCLOVIR HCL 500 MG PO TABS: 500.0000 mg | ORAL_TABLET | Freq: Two times a day (BID) | ORAL | 6 refills | Status: DC

## 2016-05-31 NOTE — Progress Notes (Addendum)
ROB:  Dopplers good this week.  Breech by U/S, possibly vtx today in office by [place].  Valtrex started.  Discussed primary CD at 37 wks if still Breech.  NST today = Reactive. Plan:  Umb dopplers and growth Monday at [facility].  If baby < 5% then delivery during 37th week method dependent upon fetal position.  If growth > 5% then continue dopplers and NSTs and deliver at 39 wks. NONSTRESS TEST INTERPRETATION  INDICATIONS: fetus small for gestational age  RESULTS:  reactive   PLAN: 1. Continue fetal kick counts as directed. 2. Continue antepartum testing as scheduled.  [name], M.D. 05/31/2016 2:19 PM

## 2016-06-04 ENCOUNTER — Ambulatory Visit (HOSPITAL_BASED_OUTPATIENT_CLINIC_OR_DEPARTMENT_OTHER)
Admission: RE | Admit: 2016-06-04 | Discharge: 2016-06-04 | Disposition: A | Discharge status: Home / Self Care | Payer: BLUE CROSS/BLUE SHIELD | Source: Ambulatory Visit | Attending: Maternal & Fetal Medicine | Admitting: Maternal & Fetal Medicine

## 2016-06-04 ENCOUNTER — Ambulatory Visit
Admission: RE | Admit: 2016-06-04 | Discharge: 2016-06-04 | Disposition: A | Discharge status: Home / Self Care | Payer: BLUE CROSS/BLUE SHIELD | Source: Ambulatory Visit | Attending: Maternal & Fetal Medicine | Admitting: Maternal & Fetal Medicine

## 2016-06-04 ENCOUNTER — Other Ambulatory Visit: Payer: Self-pay | Admitting: Obstetrics and Gynecology

## 2016-06-04 DIAGNOSIS — Z362 Encounter for other antenatal screening follow-up: Secondary | ICD-10-CM | POA: Diagnosis present

## 2016-06-04 DIAGNOSIS — O365930 Maternal care for other known or suspected poor fetal growth, third trimester, not applicable or unspecified: Secondary | ICD-10-CM

## 2016-06-04 DIAGNOSIS — Z3A36 36 weeks gestation of pregnancy: Secondary | ICD-10-CM | POA: Insufficient documentation

## 2016-06-04 NOTE — Progress Notes (Signed)
06/04/2016 Here for NST Agree with nurse's assessment of reactive NST.  Darlene Ponder, MD [facility]

## 2016-06-07 ENCOUNTER — Ambulatory Visit (INDEPENDENT_AMBULATORY_CARE_PROVIDER_SITE_OTHER): Payer: BLUE CROSS/BLUE SHIELD | Admitting: Obstetrics and Gynecology

## 2016-06-07 ENCOUNTER — Encounter: Payer: Self-pay | Admitting: Obstetrics and Gynecology

## 2016-06-07 ENCOUNTER — Encounter
Admission: RE | Admit: 2016-06-07 | Discharge: 2016-06-07 | Disposition: A | Discharge status: Home / Self Care | Payer: BLUE CROSS/BLUE SHIELD | Source: Ambulatory Visit | Attending: Obstetrics and Gynecology | Admitting: Obstetrics and Gynecology

## 2016-06-07 VITALS — BP 104/69 | HR 107 | Wt 140.5 lb

## 2016-06-07 DIAGNOSIS — O365930 Maternal care for other known or suspected poor fetal growth, third trimester, not applicable or unspecified: Secondary | ICD-10-CM

## 2016-06-07 DIAGNOSIS — Z3493 Encounter for supervision of normal pregnancy, unspecified, third trimester: Secondary | ICD-10-CM

## 2016-06-07 DIAGNOSIS — O321XX0 Maternal care for breech presentation, not applicable or unspecified: Secondary | ICD-10-CM

## 2016-06-07 HISTORY — DX: Anxiety disorder, unspecified: F41.9

## 2016-06-07 LAB — CBC
HCT: 36.5 % (ref 35.0–47.0)
Hemoglobin: 12.5 g/dL (ref 12.0–16.0)
MCH: 31 pg (ref 26.0–34.0)
MCHC: 34.1 g/dL (ref 32.0–36.0)
MCV: 90.9 fL (ref 80.0–100.0)
PLATELETS: 218 10*3/uL (ref 150–440)
RBC: 4.02 MIL/uL (ref 3.80–5.20)
RDW: 13.5 % (ref 11.5–14.5)
WBC: 8.4 10*3/uL (ref 3.6–11.0)

## 2016-06-07 LAB — POCT URINALYSIS DIPSTICK
Bilirubin, UA: NEGATIVE
Glucose, UA: NEGATIVE
Ketones, UA: NEGATIVE
Leukocytes, UA: NEGATIVE
Nitrite, UA: NEGATIVE
PROTEIN UA: NEGATIVE
RBC UA: NEGATIVE
SPEC GRAV UA: 1.01 (ref 1.010–1.025)
UROBILINOGEN UA: 0.2 U/dL
pH, UA: 6.5 (ref 5.0–8.0)

## 2016-06-07 LAB — TYPE AND SCREEN
ABO/RH(D): O POS
Antibody Screen: NEGATIVE
EXTEND SAMPLE REASON: UNDETERMINED

## 2016-06-07 NOTE — Addendum Note (Signed)
Addended by: Elonda HuskyEVANS, Guhan Bruington J on: 06/07/2016 01:35 PM   Modules accepted: Orders, SmartSet

## 2016-06-07 NOTE — Progress Notes (Signed)
Plan cesarean delivery and indications for delivery discussed in detail. All patient's questions answered.

## 2016-06-07 NOTE — Patient Instructions (Addendum)
Your procedure is scheduled on: Friday 06/08/16 Report to THE BIRTHPLACE LOCATED ON THE 3RD FLOOR VISITOR ENTRANCE .  Remember: Instructions that are not followed completely may result in serious medical risk, up to and including death, or upon the discretion of your surgeon and anesthesiologist your surgery may need to be rescheduled.    __X__ 1. Do not eat food or drink liquids after midnight. No gum chewing or hard candies.     __X__ 2. No Alcohol for 24 hours before or after surgery.   ____ 3. Bring all medications with you on the day of surgery if instructed.    __X__ 4. Notify your doctor if there is any change in your medical condition     (cold, fever, infections).             __X_5. No smoking within 24 hours of your surgery.     Do not wear jewelry, make-up, hairpins, clips or nail polish.  Do not wear lotions, powders, or perfumes.   Do not shave 48 hours prior to surgery. Men may shave face and neck.  Do not bring valuables to the hospital.    [facility] is not responsible for any belongings or valuables.               Contacts, dentures or bridgework may not be worn into surgery.  Leave your suitcase in the car. After surgery it may be brought to your room.  For patients admitted to the hospital, discharge time is determined by your                treatment team.   Patients discharged the day of surgery will not be allowed to drive home.   Please read over the following fact sheets that you were given:   MRSA Information   __x__ Take these medicines the morning of surgery with A SIP OF WATER:    1.valtrex  2.   3.   4.  5.  6.  ____ Fleet Enema (as directed)   __X__ Use CHG Soap as directed  ____ Use inhalers on the day of surgery  ____ Stop metformin 2 days prior to surgery    ____ Take 1/2 of usual insulin dose the night before surgery and none on the morning of surgery.   ____ Stop Coumadin/Plavix/aspirin on   __X__ Stop Anti-inflammatories such as  Advil, Aleve, Ibuprofen, Motrin, Naproxen, Naprosyn, Goodies,powder, or aspirin products.  OK to take Tylenol.   ____ Stop supplements until after surgery.    ____ Bring C-Pap to the hospital.

## 2016-06-07 NOTE — H&P (Signed)
History and Physical   HPI  Tenecia Coralie Carpen Mahr is a 26 y.o. G1P0000 at 7139w1d Estimated Date of Delivery: 06/27/16 who is being admitted for  C-section Patient with growth restriction 4th percentile and breech presentation.  OB History  Obstetric History   G1   P0   T0   P0   A0   L0    SAB0   TAB0   Ectopic0   Multiple0   Live Births0     # Outcome Date GA Lbr Len/2nd Weight Sex Delivery Anes PTL Lv  1 Current               PROBLEM LIST  Pregnancy complications or risks: Patient Active Problem List   Diagnosis Date Noted  . Poor fetal growth affecting management of mother 05/21/2016  . IUGR (intrauterine growth restriction) affecting care of mother 05/21/2016  . Tobacco use disorder, moderate, in early remission 02/07/2016  . 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 Anxiety disorder 02/07/2016  . Marijuana use, episodic 01/08/2016  . Papanicolaou smear of cervix with low grade squamous intraepithelial lesion (LGSIL) 02/23/2015    Prenatal labs and studies: ABO, Rh: O/Positive/-- (10/21 0000) Antibody: Negative (10/21 0000) Rubella: Immune (10/21 0000) RPR: Nonreactive (10/21 0000)  HBsAg: Negative (10/21 0000)  HIV: Non-reactive (10/21 0000)  GBS:    Past Medical History:  Diagnosis Date  . 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 Medical history non-contributory      Past Surgical History:  Procedure Laterality Date  . NECK SURGERY     mass on neck- benign     Medications    Patient's Medications  New Prescriptions   No medications on file  Previous Medications   LORATADINE (CLARITIN) 10 MG TABLET    Take 10 mg by mouth daily as needed for allergies.    PRENATAL VIT-FE FUMARATE-FA (MULTIVITAMIN-PRENATAL) 27-0.8 MG TABS TABLET    Take 1 tablet by mouth daily.    SODIUM CHLORIDE (OCEAN) 0.65 % SOLN NASAL SPRAY    Place 1 spray into both nostrils as needed for congestion.   VALACYCLOVIR (VALTREX) 500 MG  TABLET    Take 1 tablet (500 mg total) by mouth 2 (two) times daily.  Modified Medications   No medications on file  Discontinued Medications   No medications on file     Allergies  Patient has no known allergies.  Review of Systems  Pertinent items are noted in HPI.  Physical Exam  BP 104/69   Pulse (!) 107   Wt 140 lb 8 oz (63.7 kg)   LMP  (Within Months)   BMI 25.70 kg/m   Lungs:  CTA B Cardio: RRR without M/R/G Abd: Soft, gravid, NT Presentation: breech  EXT: No C/C/ 1+ Edema DTRs: 2+ B CERVIX: not evaluated:{ See Prenatal records for more detailed PE.     FHR:  NST has been reactive-BPP 8 out of 8. Normal umbilical Doppler flow.  Toco: Uterine Contractions: None   Test Results  Results for orders placed or performed in visit on 06/07/16 (from the past 24 hour(s))  POCT urinalysis dipstick     Status: None   Collection Time: 06/07/16  8:58 AM  Result Value Ref Range   Color, UA yellow    Clarity, UA clear    Glucose, UA neg    Bilirubin, UA neg    Ketones, UA neg    Spec Grav, UA 1.010 1.010 -  1.025   Blood, UA neg    pH, UA 6.5 5.0 - 8.0   Protein, UA neg    Urobilinogen, UA 0.2 0.2 or 1.0 E.U./dL   Nitrite, UA neg    Leukocytes, UA Negative Negative     Assessment   G1P0000 at [redacted]w[redacted]d Estimated Date of Delivery: 06/27/16  Growth restriction 4% Breech presentation Confirmed with perinatology needed for preterm delivery based on significant growth restriction.  Patient Active Problem List   Diagnosis Date Noted  . Poor fetal growth affecting management of mother 05/21/2016  . IUGR (intrauterine growth restriction) affecting care of mother 05/21/2016  . Tobacco use disorder, moderate, in early remission 02/07/2016  . 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 Anxiety disorder 02/07/2016  . Marijuana use, episodic 01/08/2016  . Papanicolaou smear of cervix with low grade squamous intraepithelial lesion (LGSIL) 02/23/2015     Plan  1. Admit to L&D for plan Cesarean delivery 2. EFM: -- Category 1 3. Admission labs    [name], M.D. 06/07/2016 1:32 PM

## 2016-06-08 ENCOUNTER — Encounter: Payer: Self-pay | Admitting: *Deleted

## 2016-06-08 ENCOUNTER — Inpatient Hospital Stay
Admission: RE | Admit: 2016-06-08 | Discharge: 2016-06-11 | DRG: 765 | Disposition: A | Discharge status: Home / Self Care | Payer: BLUE CROSS/BLUE SHIELD | Source: Ambulatory Visit | Attending: Obstetrics and Gynecology | Admitting: Obstetrics and Gynecology

## 2016-06-08 ENCOUNTER — Inpatient Hospital Stay: Payer: BLUE CROSS/BLUE SHIELD | Admitting: Anesthesiology

## 2016-06-08 ENCOUNTER — Encounter
Admission: RE | Disposition: A | Discharge status: Home / Self Care | Payer: Self-pay | Source: Ambulatory Visit | Attending: Obstetrics and Gynecology

## 2016-06-08 DIAGNOSIS — F419 Anxiety disorder, unspecified: Secondary | ICD-10-CM | POA: Diagnosis present

## 2016-06-08 DIAGNOSIS — O99344 Other mental disorders complicating childbirth: Secondary | ICD-10-CM | POA: Diagnosis present

## 2016-06-08 DIAGNOSIS — O99324 Drug use complicating childbirth: Secondary | ICD-10-CM | POA: Diagnosis present

## 2016-06-08 DIAGNOSIS — O9832 Other infections with a predominantly sexual mode of transmission complicating childbirth: Secondary | ICD-10-CM | POA: Diagnosis present

## 2016-06-08 DIAGNOSIS — A6000 Herpesviral infection of urogenital system, unspecified: Secondary | ICD-10-CM | POA: Diagnosis present

## 2016-06-08 DIAGNOSIS — Z87891 Personal history of nicotine dependence: Secondary | ICD-10-CM

## 2016-06-08 DIAGNOSIS — O321XX0 Maternal care for breech presentation, not applicable or unspecified: Secondary | ICD-10-CM | POA: Diagnosis present

## 2016-06-08 DIAGNOSIS — O365930 Maternal care for other known or suspected poor fetal growth, third trimester, not applicable or unspecified: Secondary | ICD-10-CM | POA: Diagnosis present

## 2016-06-08 DIAGNOSIS — Z3A37 37 weeks gestation of pregnancy: Secondary | ICD-10-CM

## 2016-06-08 DIAGNOSIS — F1290 Cannabis use, unspecified, uncomplicated: Secondary | ICD-10-CM | POA: Diagnosis present

## 2016-06-08 LAB — URINE DRUG SCREEN, QUALITATIVE (ARMC ONLY)
AMPHETAMINES, UR SCREEN: NOT DETECTED
BARBITURATES, UR SCREEN: NOT DETECTED
BENZODIAZEPINE, UR SCRN: NOT DETECTED
COCAINE METABOLITE, UR ~~LOC~~: NOT DETECTED
Cannabinoid 50 Ng, Ur ~~LOC~~: NOT DETECTED
MDMA (Ecstasy)Ur Screen: NOT DETECTED
METHADONE SCREEN, URINE: NOT DETECTED
OPIATE, UR SCREEN: NOT DETECTED
PHENCYCLIDINE (PCP) UR S: NOT DETECTED
Tricyclic, Ur Screen: NOT DETECTED

## 2016-06-08 LAB — ABO/RH: ABO/RH(D): O POS

## 2016-06-08 SURGERY — Surgical Case
Anesthesia: Spinal | Site: Abdomen | Wound class: Clean Contaminated

## 2016-06-08 MED ORDER — KETOROLAC TROMETHAMINE 30 MG/ML IJ SOLN
INTRAMUSCULAR | Status: DC | PRN
  Administered 2016-06-08: 30 mg via INTRAVENOUS

## 2016-06-08 MED ORDER — CEFAZOLIN SODIUM-DEXTROSE 2-3 GM-% IV SOLR
INTRAVENOUS | Status: DC | PRN
  Administered 2016-06-08: 2 g via INTRAVENOUS

## 2016-06-08 MED ORDER — DIPHENHYDRAMINE HCL 25 MG PO CAPS: 25.0000 mg | ORAL_CAPSULE | ORAL | Status: DC | PRN

## 2016-06-08 MED ORDER — CEFAZOLIN SODIUM-DEXTROSE 2-4 GM/100ML-% IV SOLN
2.0000 g | INTRAVENOUS | Status: DC
  Administered 2016-06-08: 2 g via INTRAVENOUS
  Filled 2016-06-08: qty 100

## 2016-06-08 MED ORDER — SODIUM CHLORIDE 0.9% FLUSH
3.0000 mL | Freq: Four times a day (QID) | INTRAVENOUS | Status: DC
  Administered 2016-06-09 (×2): 3 mL via INTRAVENOUS

## 2016-06-08 MED ORDER — NALBUPHINE HCL 10 MG/ML IJ SOLN: 5.0000 mg | Freq: Once | INTRAMUSCULAR | Status: DC | PRN

## 2016-06-08 MED ORDER — DEXTROSE 5 % IV SOLN
1.0000 ug/kg/h | INTRAVENOUS | Status: DC | PRN
  Filled 2016-06-08: qty 2

## 2016-06-08 MED ORDER — DEXAMETHASONE SODIUM PHOSPHATE 10 MG/ML IJ SOLN
INTRAMUSCULAR | Status: DC | PRN
  Administered 2016-06-08: 10 mg via INTRAVENOUS

## 2016-06-08 MED ORDER — OXYCODONE HCL 5 MG/5ML PO SOLN
5.0000 mg | Freq: Once | ORAL | Status: DC | PRN
  Filled 2016-06-08: qty 5

## 2016-06-08 MED ORDER — IBUPROFEN 600 MG PO TABS
600.0000 mg | ORAL_TABLET | Freq: Four times a day (QID) | ORAL | Status: DC
  Administered 2016-06-08 – 2016-06-11 (×11): 600 mg via ORAL
  Filled 2016-06-08 (×10): qty 1

## 2016-06-08 MED ORDER — OXYCODONE-ACETAMINOPHEN 5-325 MG PO TABS
2.0000 | ORAL_TABLET | ORAL | Status: DC | PRN
  Administered 2016-06-09 – 2016-06-11 (×2): 2 via ORAL
  Filled 2016-06-08 (×2): qty 2

## 2016-06-08 MED ORDER — ZOLPIDEM TARTRATE 5 MG PO TABS: 5.0000 mg | ORAL_TABLET | Freq: Every evening | ORAL | Status: DC | PRN

## 2016-06-08 MED ORDER — SODIUM CHLORIDE 0.9 % IV SOLN
INTRAVENOUS | Status: DC | PRN
  Administered 2016-06-08: 50 ug/min via INTRAVENOUS

## 2016-06-08 MED ORDER — ACETAMINOPHEN 500 MG PO TABS
1000.0000 mg | ORAL_TABLET | Freq: Four times a day (QID) | ORAL | Status: AC
  Administered 2016-06-08 – 2016-06-09 (×3): 1000 mg via ORAL
  Filled 2016-06-08 (×3): qty 2

## 2016-06-08 MED ORDER — LACTATED RINGERS IV SOLN: INTRAVENOUS | Status: DC

## 2016-06-08 MED ORDER — FENTANYL CITRATE (PF) 100 MCG/2ML IJ SOLN
INTRAMUSCULAR | Status: DC | PRN
  Administered 2016-06-08: 15 ug via INTRATHECAL

## 2016-06-08 MED ORDER — OXYCODONE HCL 5 MG PO TABS: 5.0000 mg | ORAL_TABLET | Freq: Once | ORAL | Status: DC | PRN

## 2016-06-08 MED ORDER — FENTANYL CITRATE (PF) 100 MCG/2ML IJ SOLN: 25.0000 ug | INTRAMUSCULAR | Status: DC | PRN

## 2016-06-08 MED ORDER — ONDANSETRON HCL 4 MG/2ML IJ SOLN
INTRAMUSCULAR | Status: DC | PRN
  Administered 2016-06-08: 4 mg via INTRAVENOUS

## 2016-06-08 MED ORDER — OXYCODONE-ACETAMINOPHEN 5-325 MG PO TABS
1.0000 | ORAL_TABLET | ORAL | Status: DC | PRN
Start: 2016-06-08 — End: 2016-06-11
  Administered 2016-06-09 – 2016-06-11 (×7): 1 via ORAL
  Filled 2016-06-08 (×7): qty 1

## 2016-06-08 MED ORDER — KETOROLAC TROMETHAMINE 30 MG/ML IJ SOLN: 30.0000 mg | Freq: Four times a day (QID) | INTRAMUSCULAR | Status: AC | PRN

## 2016-06-08 MED ORDER — FENTANYL CITRATE (PF) 100 MCG/2ML IJ SOLN
INTRAMUSCULAR | Status: AC
  Filled 2016-06-08: qty 2

## 2016-06-08 MED ORDER — COCONUT OIL OIL
1.0000 "application " | TOPICAL_OIL | Status: DC | PRN
  Administered 2016-06-09: 1 via TOPICAL
  Filled 2016-06-08: qty 120

## 2016-06-08 MED ORDER — ACETAMINOPHEN 325 MG PO TABS: 650.0000 mg | ORAL_TABLET | ORAL | Status: DC | PRN

## 2016-06-08 MED ORDER — OXYCODONE HCL 5 MG PO TABS: 5.0000 mg | ORAL_TABLET | Freq: Four times a day (QID) | ORAL | Status: AC | PRN

## 2016-06-08 MED ORDER — LACTATED RINGERS IV SOLN
INTRAVENOUS | Status: DC
  Administered 2016-06-08 (×2): 150 mL/h via INTRAVENOUS

## 2016-06-08 MED ORDER — DIPHENHYDRAMINE HCL 50 MG/ML IJ SOLN: 12.5000 mg | INTRAMUSCULAR | Status: DC | PRN

## 2016-06-08 MED ORDER — MENTHOL 3 MG MT LOZG
1.0000 | LOZENGE | OROMUCOSAL | Status: DC | PRN
  Filled 2016-06-08: qty 9

## 2016-06-08 MED ORDER — DIPHENHYDRAMINE HCL 25 MG PO CAPS: 25.0000 mg | ORAL_CAPSULE | Freq: Four times a day (QID) | ORAL | Status: DC | PRN

## 2016-06-08 MED ORDER — SODIUM CHLORIDE 0.9% FLUSH: 3.0000 mL | INTRAVENOUS | Status: DC | PRN

## 2016-06-08 MED ORDER — PRENATAL MULTIVITAMIN CH
1.0000 | ORAL_TABLET | Freq: Every day | ORAL | Status: DC
  Administered 2016-06-09 – 2016-06-10 (×2): 1 via ORAL
  Filled 2016-06-08: qty 1

## 2016-06-08 MED ORDER — BUPIVACAINE IN DEXTROSE 0.75-8.25 % IT SOLN
INTRATHECAL | Status: DC | PRN
  Administered 2016-06-08: 1.6 mL via INTRATHECAL

## 2016-06-08 MED ORDER — PROPOFOL 10 MG/ML IV BOLUS
INTRAVENOUS | Status: AC
  Filled 2016-06-08: qty 20

## 2016-06-08 MED ORDER — OXYTOCIN 40 UNITS IN LACTATED RINGERS INFUSION - SIMPLE MED
2.5000 [IU]/h | INTRAVENOUS | Status: DC
  Filled 2016-06-08 (×3): qty 1000

## 2016-06-08 MED ORDER — SOD CITRATE-CITRIC ACID 500-334 MG/5ML PO SOLN
ORAL | Status: AC
  Administered 2016-06-08: 30 mL
  Filled 2016-06-08: qty 15

## 2016-06-08 MED ORDER — NALBUPHINE HCL 10 MG/ML IJ SOLN: 5.0000 mg | INTRAMUSCULAR | Status: DC | PRN

## 2016-06-08 MED ORDER — SIMETHICONE 80 MG PO CHEW
80.0000 mg | CHEWABLE_TABLET | Freq: Four times a day (QID) | ORAL | Status: DC
  Administered 2016-06-08 – 2016-06-11 (×11): 80 mg via ORAL
  Filled 2016-06-08 (×10): qty 1

## 2016-06-08 MED ORDER — DEXTROSE 5 % IV SOLN
2.0000 g | INTRAVENOUS | Status: AC
  Filled 2016-06-08: qty 2000

## 2016-06-08 MED ORDER — MORPHINE SULFATE (PF) 0.5 MG/ML IJ SOLN
INTRAMUSCULAR | Status: AC
  Filled 2016-06-08: qty 10

## 2016-06-08 MED ORDER — OXYTOCIN 40 UNITS IN LACTATED RINGERS INFUSION - SIMPLE MED
INTRAVENOUS | Status: AC
  Filled 2016-06-08: qty 1000

## 2016-06-08 MED ORDER — PHENYLEPHRINE 40 MCG/ML (10ML) SYRINGE FOR IV PUSH (FOR BLOOD PRESSURE SUPPORT)
PREFILLED_SYRINGE | INTRAVENOUS | Status: DC | PRN
  Administered 2016-06-08: 100 ug via INTRAVENOUS

## 2016-06-08 MED ORDER — OXYTOCIN 40 UNITS IN LACTATED RINGERS INFUSION - SIMPLE MED
INTRAVENOUS | Status: DC | PRN
  Administered 2016-06-08: 1000 mL via INTRAVENOUS

## 2016-06-08 MED ORDER — KETOROLAC TROMETHAMINE 30 MG/ML IJ SOLN
30.0000 mg | Freq: Four times a day (QID) | INTRAMUSCULAR | Status: AC | PRN
Start: 2016-06-08 — End: 2016-06-09

## 2016-06-08 MED ORDER — SENNOSIDES-DOCUSATE SODIUM 8.6-50 MG PO TABS
2.0000 | ORAL_TABLET | ORAL | Status: DC
  Administered 2016-06-09 – 2016-06-11 (×3): 2 via ORAL
  Filled 2016-06-08 (×3): qty 2

## 2016-06-08 MED ORDER — PHENYLEPHRINE HCL 10 MG/ML IJ SOLN
INTRAMUSCULAR | Status: AC
  Filled 2016-06-08: qty 1

## 2016-06-08 MED ORDER — MORPHINE SULFATE-NACL 0.5-0.9 MG/ML-% IV SOSY
PREFILLED_SYRINGE | INTRAVENOUS | Status: DC | PRN
  Administered 2016-06-08: .1 mg via INTRATHECAL

## 2016-06-08 MED ORDER — ONDANSETRON HCL 4 MG/2ML IJ SOLN: 4.0000 mg | Freq: Three times a day (TID) | INTRAMUSCULAR | Status: DC | PRN

## 2016-06-08 MED ORDER — NALOXONE HCL 0.4 MG/ML IJ SOLN: 0.4000 mg | INTRAMUSCULAR | Status: DC | PRN

## 2016-06-08 SURGICAL SUPPLY — 26 items
ADHESIVE MASTISOL STRL (MISCELLANEOUS) ×3 IMPLANT
BAG COUNTER SPONGE EZ (MISCELLANEOUS) ×4 IMPLANT
BENZOIN TINCTURE PRP APPL 2/3 (GAUZE/BANDAGES/DRESSINGS) ×3 IMPLANT
CANISTER SUCT 3000ML PPV (MISCELLANEOUS) ×3 IMPLANT
CELL SAVER LIPIGURD (MISCELLANEOUS) IMPLANT
CHLORAPREP W/TINT 26ML (MISCELLANEOUS) ×6 IMPLANT
CLOSURE WOUND 1/2 X4 (GAUZE/BANDAGES/DRESSINGS) ×1
COUNTER SPONGE BAG EZ (MISCELLANEOUS) ×2
DRSG TELFA 3X8 NADH (GAUZE/BANDAGES/DRESSINGS) ×3 IMPLANT
EXTRT SYSTEM ALEXIS 14CM (MISCELLANEOUS)
GAUZE SPONGE 4X4 12PLY STRL (GAUZE/BANDAGES/DRESSINGS) ×3 IMPLANT
GLOVE INDICATOR 7.0 STRL GRN (GLOVE) ×3 IMPLANT
GLOVE ORTHO TXT STRL SZ7.5 (GLOVE) ×3 IMPLANT
GLOVE PROTEXIS LATEX SZ 7.5 (GLOVE) ×3 IMPLANT
GOWN STRL REUS W/ TWL LRG LVL3 (GOWN DISPOSABLE) ×2 IMPLANT
GOWN STRL REUS W/TWL LRG LVL3 (GOWN DISPOSABLE) ×4
KIT RM TURNOVER STRD PROC AR (KITS) ×3 IMPLANT
NS IRRIG 1000ML POUR BTL (IV SOLUTION) ×3 IMPLANT
PACK C SECTION AR (MISCELLANEOUS) ×3 IMPLANT
PAD OB MATERNITY 4.3X12.25 (PERSONAL CARE ITEMS) ×3 IMPLANT
PAD PREP 24X41 OB/GYN DISP (PERSONAL CARE ITEMS) ×3 IMPLANT
RTRCTR C-SECT PINK 25CM LRG (MISCELLANEOUS) ×3 IMPLANT
SPONGE LAP 18X18 5 PK (GAUZE/BANDAGES/DRESSINGS) ×3 IMPLANT
STRIP CLOSURE SKIN 1/2X4 (GAUZE/BANDAGES/DRESSINGS) ×2 IMPLANT
SUT VIC AB 1 CT1 36 (SUTURE) ×6 IMPLANT
SUT VICRYL+ 3-0 36IN CT-1 (SUTURE) ×6 IMPLANT

## 2016-06-08 NOTE — Interval H&P Note (Signed)
History and Physical Interval Note:  06/08/2016 9:37 AM  Darlene Miller  has presented today for surgery, with the diagnosis of BREECH PRESENTATION  The various methods of treatment have been discussed with the patient and family. After consideration of risks, benefits and other options for treatment, the patient has consented to  Procedure(s): CESAREAN SECTION (N/A) as a surgical intervention .  The patient's history has been reviewed, patient examined, no change in status, stable for surgery.  I have reviewed the patient's chart and labs.  Questions were answered to the patient's satisfaction.     Brennan Baileyavid Harbour Nordmeyer

## 2016-06-08 NOTE — H&P (View-Only) (Signed)
History and Physical   HPI  Darlene Miller is a 26 y.o. G1P0000 at 7139w1d Estimated Date of Delivery: 06/27/16 who is being admitted for  C-section Patient with growth restriction 4th percentile and breech presentation.  OB History  Obstetric History   G1   P0   T0   P0   A0   L0    SAB0   TAB0   Ectopic0   Multiple0   Live Births0     # Outcome Date GA Lbr Len/2nd Weight Sex Delivery Anes PTL Lv  1 Current               PROBLEM LIST  Pregnancy complications or risks: Patient Active Problem List   Diagnosis Date Noted  . Poor fetal growth affecting management of mother 05/21/2016  . IUGR (intrauterine growth restriction) affecting care of mother 05/21/2016  . Tobacco use disorder, moderate, in early remission 02/07/2016  . 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 Anxiety disorder 02/07/2016  . Marijuana use, episodic 01/08/2016  . Papanicolaou smear of cervix with low grade squamous intraepithelial lesion (LGSIL) 02/23/2015    Prenatal labs and studies: ABO, Rh: O/Positive/-- (10/21 0000) Antibody: Negative (10/21 0000) Rubella: Immune (10/21 0000) RPR: Nonreactive (10/21 0000)  HBsAg: Negative (10/21 0000)  HIV: Non-reactive (10/21 0000)  GBS:    Past Medical History:  Diagnosis Date  . 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 Medical history non-contributory      Past Surgical History:  Procedure Laterality Date  . NECK SURGERY     mass on neck- benign     Medications    Patient's Medications  New Prescriptions   No medications on file  Previous Medications   LORATADINE (CLARITIN) 10 MG TABLET    Take 10 mg by mouth daily as needed for allergies.    PRENATAL VIT-FE FUMARATE-FA (MULTIVITAMIN-PRENATAL) 27-0.8 MG TABS TABLET    Take 1 tablet by mouth daily.    SODIUM CHLORIDE (OCEAN) 0.65 % SOLN NASAL SPRAY    Place 1 spray into both nostrils as needed for congestion.   VALACYCLOVIR (VALTREX) 500 MG  TABLET    Take 1 tablet (500 mg total) by mouth 2 (two) times daily.  Modified Medications   No medications on file  Discontinued Medications   No medications on file     Allergies  Patient has no known allergies.  Review of Systems  Pertinent items are noted in HPI.  Physical Exam  BP 104/69   Pulse (!) 107   Wt 140 lb 8 oz (63.7 kg)   LMP  (Within Months)   BMI 25.70 kg/m   Lungs:  CTA B Cardio: RRR without M/R/G Abd: Soft, gravid, NT Presentation: breech  EXT: No C/C/ 1+ Edema DTRs: 2+ B CERVIX: not evaluated:{ See Prenatal records for more detailed PE.     FHR:  NST has been reactive-BPP 8 out of 8. Normal umbilical Doppler flow.  Toco: Uterine Contractions: None   Test Results  Results for orders placed or performed in visit on 06/07/16 (from the past 24 hour(s))  POCT urinalysis dipstick     Status: None   Collection Time: 06/07/16  8:58 AM  Result Value Ref Range   Color, UA yellow    Clarity, UA clear    Glucose, UA neg    Bilirubin, UA neg    Ketones, UA neg    Spec Grav, UA 1.010 1.010 -  1.025   Blood, UA neg    pH, UA 6.5 5.0 - 8.0   Protein, UA neg    Urobilinogen, UA 0.2 0.2 or 1.0 E.U./dL   Nitrite, UA neg    Leukocytes, UA Negative Negative     Assessment   G1P0000 at [redacted]w[redacted]d Estimated Date of Delivery: 06/27/16  Growth restriction 4% Breech presentation Confirmed with perinatology needed for preterm delivery based on significant growth restriction.  Patient Active Problem List   Diagnosis Date Noted  . Poor fetal growth affecting management of mother 05/21/2016  . IUGR (intrauterine growth restriction) affecting care of mother 05/21/2016  . Tobacco use disorder, moderate, in early remission 02/07/2016  . 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 Anxiety disorder 02/07/2016  . Marijuana use, episodic 01/08/2016  . Papanicolaou smear of cervix with low grade squamous intraepithelial lesion (LGSIL) 02/23/2015     Plan  1. Admit to L&D for plan Cesarean delivery 2. EFM: -- Category 1 3. Admission labs    [name], M.D. 06/07/2016 1:32 PM

## 2016-06-08 NOTE — Consult Note (Signed)
Neonatology Note:   Attendance at C-section:    I was asked by Dr. Evans to attend this elective C/S at term for fetal growth restriction and breech presentation. The mother is a G1, GBS not done with good prenatal care. Pregnancy complicated by THC use, tobacco abuse, HSV on Valtrex prophy and IUGR.  ROM at delivery, fluid clear.  DCC not done due to poor tone and respiratory effort.  Infant brought to warmer and dried and stimulated.  HR >100 and responded well with increased vigor and resp effort.  Needed only minimal bulb suctioning. Ap 8/9. Lungs clear to ausc in DR. To CN to care of Pediatrician.   David C. Ehrmann, MD  

## 2016-06-08 NOTE — Plan of Care (Signed)
Taking sips of clear liquid PO at this time. Darlene Miller RNC

## 2016-06-08 NOTE — Anesthesia Procedure Notes (Signed)
Spinal  Start time: 06/08/2016 10:07 AM End time: 06/08/2016 10:10 AM Staffing Resident/CRNA: Almeta MonasFLETCHER, Irlene Crudup Performed: resident/CRNA  Preanesthetic Checklist Completed: patient identified, surgical consent, pre-op evaluation, timeout performed, IV checked, risks and benefits discussed and monitors and equipment checked Spinal Block Patient position: sitting Prep: ChloraPrep Patient monitoring: heart rate, continuous pulse ox and blood pressure Approach: midline Location: L3-4 Injection technique: single-shot Needle Needle type: Whitacre  Needle gauge: 25 G Needle length: 9 cm Assessment Sensory level: T3

## 2016-06-08 NOTE — Anesthesia Post-op Follow-up Note (Cosign Needed)
Anesthesia QCDR form completed.        

## 2016-06-08 NOTE — Transfer of Care (Signed)
Immediate Anesthesia Transfer of Care Note  Patient: Darlene Miller  Procedure(s) Performed: Procedure(s) with comments: CESAREAN SECTION (N/A) - Female born @ 1041 Weight: 5lbs  Apgars: 8/9  Patient Location: Mother/Baby  Anesthesia Type:Spinal  Level of Consciousness: awake, alert  and oriented  Airway & Oxygen Therapy: Patient Spontanous Breathing  Post-op Assessment: Report given to RN and Post -op Vital signs reviewed and stable  Post vital signs: Reviewed  Last Vitals:  Vitals:   06/08/16 0829 06/08/16 1133  BP:  116/68  Pulse:  78  Resp:  18  Temp: 36.8 C 36.3 C    Last Pain:  Vitals:   06/08/16 0829  TempSrc: Oral  PainSc:          Complications: No apparent anesthesia complications

## 2016-06-08 NOTE — Anesthesia Preprocedure Evaluation (Signed)
Anesthesia Evaluation  Patient identified by MRN, date of birth, ID band Patient awake    Reviewed: Allergy & Precautions, H&P , NPO status , Patient's Chart, lab work & pertinent test results  History of Anesthesia Complications Negative for: history of anesthetic complications  Airway Mallampati: III  TM Distance: >3 FB Neck ROM: full    Dental  (+) Chipped   Pulmonary neg shortness of breath, former smoker,    Pulmonary exam normal breath sounds clear to auscultation       Cardiovascular Exercise Tolerance: Good (-) hypertensionNormal cardiovascular exam Rhythm:regular Rate:Normal     Neuro/Psych PSYCHIATRIC DISORDERS Anxiety    GI/Hepatic negative GI ROS,   Endo/Other    Renal/GU   negative genitourinary   Musculoskeletal   Abdominal   Peds  Hematology negative hematology ROS (+)   Anesthesia Other Findings Past Medical History: No date: Anxiety No date: LGSIL (low grade squamous intraepithelial dysp*     Comment: w pos hpv No date: Medical history non-contributory  Past Surgical History: No date: NECK SURGERY     Comment: mass on neck- benign     Reproductive/Obstetrics (+) Pregnancy                             Anesthesia Physical Anesthesia Plan  ASA: II  Anesthesia Plan: Spinal   Post-op Pain Management:    Induction:   Airway Management Planned: Natural Airway and Nasal Cannula  Additional Equipment:   Intra-op Plan:   Post-operative Plan:   Informed Consent: I have reviewed the patients History and Physical, chart, labs and discussed the procedure including the risks, benefits and alternatives for the proposed anesthesia with the patient or authorized representative who has indicated his/her understanding and acceptance.   Dental Advisory Given  Plan Discussed with: Anesthesiologist, CRNA and Surgeon  Anesthesia Plan Comments: (Plan for spinal with  backup GA  Patient consented for risks of anesthesia including but not limited to:  - adverse reactions to medications - risk of bleeding, infection, nerve damage and headache - risk of failed spinal - damage to teeth, lips or other oral mucosa - sore throat or hoarseness - Damage to heart, brain, lungs or loss of life  Patient voiced understanding.)       Anesthesia Quick Evaluation

## 2016-06-09 LAB — CBC
HEMATOCRIT: 30.5 % — AB (ref 35.0–47.0)
Hemoglobin: 10.5 g/dL — ABNORMAL LOW (ref 12.0–16.0)
MCH: 31.1 pg (ref 26.0–34.0)
MCHC: 34.4 g/dL (ref 32.0–36.0)
MCV: 90.4 fL (ref 80.0–100.0)
PLATELETS: 206 10*3/uL (ref 150–440)
RBC: 3.37 MIL/uL — ABNORMAL LOW (ref 3.80–5.20)
RDW: 13.3 % (ref 11.5–14.5)
WBC: 14.7 10*3/uL — AB (ref 3.6–11.0)

## 2016-06-09 MED ORDER — DIBUCAINE 1 % RE OINT: 1.0000 "application " | TOPICAL_OINTMENT | RECTAL | Status: DC | PRN

## 2016-06-09 MED ORDER — WITCH HAZEL-GLYCERIN EX PADS: 1.0000 "application " | MEDICATED_PAD | CUTANEOUS | Status: DC | PRN

## 2016-06-09 NOTE — Anesthesia Post-op Follow-up Note (Signed)
  Anesthesia Pain Follow-up Note  Patient: Darlene BergAngeline N Miller  Day #: 1  Date of Follow-up: 06/09/2016 Time: 12:13 PM  Last Vitals:  Vitals:   06/09/16 0405 06/09/16 0745  BP: (!) 98/46 (!) 97/50  Pulse: 76 71  Resp: 20 18  Temp: 36.6 C 36.5 C    Level of Consciousness: alert  Pain: mild   Side Effects:None  Catheter Site Exam:dry, no drainage     Plan: D/C from anesthesia care at surgeon's request  Shermaine Brigham

## 2016-06-09 NOTE — Anesthesia Postprocedure Evaluation (Signed)
Anesthesia Post Note  Patient: Darlene Miller  Procedure(s) Performed: Procedure(s) (LRB): CESAREAN SECTION (N/A)  Patient location during evaluation: Mother Baby Anesthesia Type: Spinal Level of consciousness: awake and alert and oriented Pain management: pain level controlled Vital Signs Assessment: post-procedure vital signs reviewed and stable Respiratory status: spontaneous breathing Cardiovascular status: blood pressure returned to baseline Anesthetic complications: no     Last Vitals:  Vitals:   06/09/16 0405 06/09/16 0745  BP: (!) 98/46 (!) 97/50  Pulse: 76 71  Resp: 20 18  Temp: 36.6 C 36.5 C    Last Pain:  Vitals:   06/09/16 0745  TempSrc: Oral  PainSc:                  Darlene Miller

## 2016-06-09 NOTE — Progress Notes (Addendum)
Patient ID: Darlene BergAngeline N Miller, female   DOB: [birth year], 28 y.o.   MRN: [id number]  Progress Note - Vaginal Delivery  Darlene Miller is a 26 y.o. G1P0000 now PP day 1 s/p C-Section, Low Transverse .   Subjective:  The patient reports no complaints  Objective:  Vital signs in last 24 hours: Temp:  [97.4 F (36.3 C)-98.4 F (36.9 C)] 97.7 F (36.5 C) (05/05 0745) Pulse Rate:  [61-99] 71 (05/05 0745) Resp:  [16-20] 18 (05/05 0745) BP: (81-116)/(44-68) 97/50 (05/05 0745) SpO2:  [96 %-100 %] 99 % (05/05 0745)  Physical Exam:  General: no distress Lochia: appropriate Uterine Fundus: firm Incision - dressing intact    Data Review  Recent Labs  06/07/16 1501 06/09/16 0625  HGB 12.5 10.5*  HCT 36.5 30.5*    Assessment/Plan: Active Problems:   Delivery by cesarean section using transverse incision of lower segment of uterus   No problems.   Plan for discharge tomorrow  -- Continue routine PP care.     Elonda Huskyavid J. Evans, M.D. 06/09/2016 10:52 AM

## 2016-06-10 NOTE — Progress Notes (Signed)
Patient ID: Darlene BergAngeline N Miller, female   DOB: [birth year], 26 y.o.   MRN: [id number]    Progress Note - Cesarean Delivery  Darlene Miller is a 26 y.o. G1P0000 now PP day 2 s/p C-Section, Low Transverse .   Subjective:  Patient reports no problems with eating, bowel movements, voiding, or her wound Breastfeeding.  Objective:  Vital signs in last 24 hours: Temp:  [97.8 F (36.6 C)-98.3 F (36.8 C)] 97.8 F (36.6 C) (05/06 0838) Pulse Rate:  [70-89] 89 (05/06 0838) Resp:  [17-18] 17 (05/06 0838) BP: (92-97)/(51-59) 92/56 (05/06 0838) SpO2:  [99 %-100 %] 99 % (05/06 [id number])  Physical Exam:  General: alert, cooperative and no distress Lochia: appropriate Uterine Fundus: firm Incision: healing well DVT Evaluation: No evidence of DVT seen on physical exam.    Data Review  Recent Labs  06/07/16 1501 06/09/16 0625  HGB 12.5 10.5*  HCT 36.5 30.5*    Assessment:  Active Problems:   Delivery by cesarean section using transverse incision of lower segment of uterus   Status post Cesarean section. Doing well postoperatively.     Plan:       Continue current care.  Expect discharge tomorrow.  Elonda Huskyavid J. Kennedy Brines, M.D. 06/10/2016 10:38 AM

## 2016-06-11 ENCOUNTER — Other Ambulatory Visit: Payer: BLUE CROSS/BLUE SHIELD

## 2016-06-11 ENCOUNTER — Ambulatory Visit: Payer: BLUE CROSS/BLUE SHIELD

## 2016-06-11 MED ORDER — OXYCODONE-ACETAMINOPHEN 5-325 MG PO TABS: 1.0000 | ORAL_TABLET | ORAL | 0 refills | Status: DC | PRN

## 2016-06-11 NOTE — Progress Notes (Signed)
Pt discharged home with infant.  Discharge instructions and follow up appointment given to and reviewed with pt.  Pt verbalized understanding.  Escorted by auxillary. 

## 2016-06-11 NOTE — Discharge Summary (Signed)
  Physician Obstetric Discharge Summary  Patient ID: Darlene Miller MRN: [id number] DOB/AGE: March 27, 1990 26 y.o.   Date of Admission: 06/08/2016  Date of Discharge: 06/11/2016  Admitting Diagnosis: Scheduled cesarean section at 7937w5d  Secondary Diagnosis: IUGR and Breech  Mode of Delivery: primary cesarean section  low uterine, transverse     Discharge Diagnosis: No other diagnosis   Complications: none                        Discharge Day SOAP Note:  Subjective:  The patient has no complaints.  She is ambulating well. She is taking PO well. Pain is well controlled with current medications. Patient is urinating without difficulty.   She is passing flatus.  Breastfeeding without problem.  Objective  Vital signs in last 24 hours: BP 104/66 (BP Location: Right Arm)   Pulse (!) 101   Temp 98 F (36.7 C) (Oral)   Resp 18   LMP  (Within Months)   SpO2 98%   Physical Exam: Gen: NAD Abdomen:  clean, dry, healing Fundus Fundal Tone: Firm  Lochia Amount: Small     Data Review Labs: CBC Latest Ref Rng & Units 06/09/2016 06/07/2016 04/04/2016  WBC 3.6 - 11.0 K/uL 14.7(H) 8.4 -  Hemoglobin 12.0 - 16.0 g/dL 10.5(L) 12.5 -  Hematocrit 35.0 - 47.0 % 30.5(L) 36.5 36.6  Platelets 150 - 440 K/uL 206 218 -   O POS  Assessment:  Active Problems:   Delivery by cesarean section using transverse incision of lower segment of uterus   Doing well.  Normal progress as expected.    Plan:  Discharge to home  Modified rest as directed - may slowly resume normal activities with restrictions as discussed.  Medications as written.  See below for additional.       Discharge Instructions: Per After Visit Summary. Activity: Advance as tolerated. Pelvic rest for 6 weeks.  Also refer to After Visit Summary.  Wound care discussed. Diet: Regular Medications: Allergies as of 06/11/2016   No Known Allergies     Medication List    STOP taking these medications   CLARITIN 10 MG  tablet Generic drug:  loratadine     TAKE these medications   multivitamin-prenatal 27-0.8 MG Tabs tablet Take 1 tablet by mouth daily.   oxyCODONE-acetaminophen 5-325 MG tablet Commonly known as:  PERCOCET/ROXICET Take 1 tablet by mouth every 4 (four) hours as needed (pain scale 4-7).   sodium chloride 0.65 % Soln nasal spray Commonly known as:  OCEAN Place 1 spray into both nostrils as needed for congestion.   valACYclovir 500 MG tablet Commonly known as:  VALTREX Take 1 tablet (500 mg total) by mouth 2 (two) times daily.      Outpatient follow up:  Follow-up Information    Linzie CollinEvans, David James, MD. Schedule an appointment as soon as possible for a visit in 7 day(s).   Specialty:  Obstetrics and Gynecology Contact information: [address] [place] [address] [phone]          Postpartum contraception: discuss at 1 wk appointment  Discharged Condition: good  Discharged to: home  Newborn Data: Disposition:home with mother  Apgars: APGAR (1 MIN): 8   APGAR (5 MINS): 9   APGAR (10 MINS):    Baby Feeding: Breast  Elonda Huskyavid J. Evans, M.D. 06/11/2016 10:22 AM

## 2016-06-11 NOTE — Discharge Instructions (Signed)
Postpartum Care After Cesarean Delivery  The period of time right after you deliver your newborn is called the postpartum period.  What kind of medical care will I receive?  · You may continue to receive fluids and medicines through an IV tube inserted into one of your veins.  · You may have small, flexible tube (catheter) draining urine from your bladder into a bag outside of your body. The catheter will be removed as soon as possible.  · You may be given a squirt bottle to use when you go to the bathroom. You may use this until you are comfortable wiping as usual. To use the squirt bottle, follow these steps:  ? Before you urinate, fill the squirt bottle with warm water. The water should be warm. Do not use hot water.  ? After you urinate, while you are sitting on the toilet, use the squirt bottle to rinse the area around your urethra and vaginal opening. This rinses away any urine and blood.  ? You may do this instead of wiping. As you start healing, you may use the squirt bottle before wiping yourself. Make sure to wipe gently.  ? Fill the squirt bottle with clean water every time you use the bathroom.  · You will be given sanitary pads to wear.  · Your incision will be monitored to make sure it is healing properly. You will be told when it is safe for your stitches, staples, or skin adhesive tape to be removed.  What can I expect?  · You may not feel the need to urinate for several hours after delivery.  · You will have some soreness and pain in your abdomen. You may have a small amount of blood or clear fluid coming from your incision.  · If you are breastfeeding, you may have uterine contractions every time you breastfeed for up to several weeks postpartum. Uterine contractions help your uterus return to its normal size.  · It is normal to have vaginal bleeding (lochia) after delivery. The amount and appearance of lochia is often similar to a menstrual period in the first week after delivery. It will  gradually decrease over the next few weeks to a dry, yellow-brown discharge. For most women, lochia stops completely by 6-8 weeks after delivery. Vaginal bleeding can vary from woman to woman.  · Within the first few days after delivery, you may have breast engorgement. This is when your breasts feel heavy, full, and uncomfortable. Your breasts may also throb and feel hard, tightly stretched, warm, and tender. After this occurs, you may have milk leaking from your breasts. Your health care provider can help you relieve discomfort due to breast engorgement. Breast engorgement should go away within a few days.  · You may feel more sad or worried than normal due to hormonal changes after delivery. These feelings should not last more than a few days. If these feelings do not go away after several days, speak with your health care provider.  How should I care for myself?  · Tell your health care provider if you have pain or discomfort.  · Drink enough water to keep your urine clear or pale yellow.  · Wash your hands thoroughly with soap and water for at least 20 seconds after changing your sanitary pads or using the toilet, and before holding or feeding your baby.  · If you are not breastfeeding, avoid touching your breasts a lot. Doing this can make your breasts produce more milk.  · If   you become weak or lightheaded, or you feel like you might faint, ask for help before:  ? Getting out of bed.  ? Showering.  · Change your sanitary pads frequently. Watch for any changes in your flow, such as a sudden increase in volume, a change in color, or the passing of large blood clots. If you pass a blood clot from your vagina, save it to show to your health care provider. Do not flush blood clots down the toilet without having your health care provider look at them.  · Make sure that all your vaccinations are up to date. This can help protect you and your baby from getting certain diseases. You may need to have immunizations done  before you leave the hospital.  · If desired, talk with your health care provider about methods of family planning or birth control (contraception).  How can I start bonding with my baby?  Spending as much time as possible with your baby is very important. During this time, you and your baby can get to know each other and develop a bond. Having your baby stay with you in your room (rooming in) can give you time to get to know your baby. Rooming in can also help you become comfortable caring for your baby. Breastfeeding can also help you bond with your baby.  How can I plan for returning home with my baby?  · Make sure that you have a car seat installed in your vehicle.  ? Your car seat should be checked by a certified car seat installer to make sure that it is installed safely.  ? Make sure that your baby fits into the car seat safely.  · Ask your health care provider any questions you have about caring for yourself or your baby. Make sure that you are able to contact your health care provider with any questions after leaving the hospital.  This information is not intended to replace advice given to you by your health care provider. Make sure you discuss any questions you have with your health care provider.  Document Released: 10/17/2011 Document Revised: 06/27/2015 Document Reviewed: 12/27/2014  Elsevier Interactive Patient Education © 2017 [employer]

## 2016-06-11 NOTE — Lactation Note (Signed)
This note was copied from a baby's chart. Lactation Consultation Note  Patient Name: Darlene Esaw Dacengeline Hardge BMWUX'LToday's Date: 06/11/2016 Reason for consult: Follow-up assessment   Maternal Data    Feeding Feeding Type: Bottle Fed - Breast Milk Nipple Type: Slow - flow Length of feed: 10 min  LATCH Score/Interventions                      Lactation Tools Discussed/Used  Hand pump and DEBP Cabbage Ice packs Coconut oil   Consult Status Consult Status: Complete Mom is engorged in both breasts. Especially the left br. Used cabbage leaves and ice packs followed by massage, hand expression, and DEBP for 20mins. Mom filled 2 colostrum cups and 2 snappies during this time. Relief (not empty) is what LC has instructed mom to do until engorgement subsides.    Burnadette PeterJaniya M [name] 06/11/2016, 12:18 PM

## 2016-06-13 ENCOUNTER — Ambulatory Visit
Admission: RE | Admit: 2016-06-13 | Discharge: 2016-06-13 | Disposition: A | Discharge status: Home / Self Care | Payer: BLUE CROSS/BLUE SHIELD | Source: Ambulatory Visit | Attending: Obstetrics and Gynecology | Admitting: Obstetrics and Gynecology

## 2016-06-13 NOTE — Lactation Note (Signed)
Lactation Consultation Note  Patient Name: Darlene Miller ZOXWR'UToday's Date: 06/13/2016     Maternal Data  Mom has been engorged since d/c from hospital on 06/11/16. [facility] instructed mom to use cabbage leaves, ice packs, massage,and hand express to comfort or pump if engorgement persisted. [facility] trying to get engorgement to go down and was issued a [employer] DEBP. Mom has been unable to get baby latched onto breasts due to engorgement. More full on rt side. Left side is able to flow.   Feeding  "[name]" was able to feed after 15min of mom icing breasts, massage, and DEBP to make nipple firm for a latch. Baby was able to unclog several ducts during the 20min feeding and milk began to leak out, reducing engorgement.   LATCH Score/Interventions   Baby was able to latch w/ the help of a 20mm nipple shield due to mom's sore nipple and lack of breastfeeding since d/c                   Lactation Tools Discussed/Used  20mm nipple shield, DEBP, hand pump, ice pack    Consult Status  Mom is to f/u with breastfeeding peer counselor this afternoon and LC tomorrow.     Burnadette PeterJaniya M Quynn Vilchis 06/13/2016, 11:43 AM

## 2016-06-15 ENCOUNTER — Telehealth: Payer: Self-pay | Admitting: Obstetrics and Gynecology

## 2016-06-15 NOTE — Telephone Encounter (Signed)
Patient wants to know if she can get more pain medication - Percacet - for the csection pain  Please call

## 2016-06-18 ENCOUNTER — Telehealth: Payer: Self-pay | Admitting: Obstetrics and Gynecology

## 2016-06-18 ENCOUNTER — Other Ambulatory Visit: Payer: Self-pay

## 2016-06-18 DIAGNOSIS — G8918 Other acute postprocedural pain: Secondary | ICD-10-CM

## 2016-06-18 MED ORDER — IBUPROFEN 800 MG PO TABS: 800.0000 mg | ORAL_TABLET | Freq: Three times a day (TID) | ORAL | 1 refills | Status: DC | PRN

## 2016-06-18 MED ORDER — OXYCODONE-ACETAMINOPHEN 5-325 MG PO TABS: 1.0000 | ORAL_TABLET | Freq: Four times a day (QID) | ORAL | 0 refills | Status: DC | PRN

## 2016-06-18 NOTE — Telephone Encounter (Signed)
Patient Called wanting pain medication (oxyCODONE-acetaminophen (PERCOCET/ROXICET) 5-325 MG tablet)  for 1 wk post op, Patient confirmed she will be here to pick up prescription today 06/18/2016 after an 11:00a appointment at a different office. Please advise.

## 2016-06-19 ENCOUNTER — Ambulatory Visit (INDEPENDENT_AMBULATORY_CARE_PROVIDER_SITE_OTHER): Payer: BLUE CROSS/BLUE SHIELD | Admitting: Obstetrics and Gynecology

## 2016-06-19 ENCOUNTER — Encounter: Payer: BLUE CROSS/BLUE SHIELD | Admitting: Obstetrics and Gynecology

## 2016-06-19 ENCOUNTER — Encounter: Payer: Self-pay | Admitting: Obstetrics and Gynecology

## 2016-06-19 VITALS — BP 105/65 | HR 88 | Ht 62.0 in | Wt 128.6 lb

## 2016-06-19 DIAGNOSIS — Z9889 Other specified postprocedural states: Secondary | ICD-10-CM

## 2016-06-19 NOTE — Progress Notes (Signed)
HPI:      [name] is a 26 y.o. G1P0000 who LMP was No LMP recorded.  Subjective:   She presents today Approximately 1 week after cesarean delivery. She is doing well. She is ambulating voiding and eating and drinking without difficulty. She reports no problems with her incision. She is breast-feeding full-time. She would like to receive Depo-Provera for birth control at her 6 week postpartum visit.    Hx: The following portions of the patient's history were reviewed and updated as appropriate:             She  has a past medical history of Anxiety; LGSIL (low grade squamous intraepithelial dysplasia); and Medical history non-contributory. She  does not have any pertinent problems on file. She  has a past surgical history that includes Neck surgery and Cesarean section (N/A, 06/08/2016). Her family history includes Hypertension in her mother; Restless legs syndrome in her mother. She  reports that she quit smoking about 10 months ago. Her smoking use included Cigarettes. She smoked 0.25 packs per day. She has never used smokeless tobacco. She reports that she does not drink alcohol or use drugs. She has No Known Allergies.       Review of Systems:  Review of Systems  Constitutional: Denied constitutional symptoms, night sweats, recent illness, fatigue, fever, insomnia and weight loss.  Eyes: Denied eye symptoms, eye pain, photophobia, vision change and visual disturbance.  Ears/Nose/Throat/Neck: Denied ear, nose, throat or neck symptoms, hearing loss, nasal discharge, sinus congestion and sore throat.  Cardiovascular: Denied cardiovascular symptoms, arrhythmia, chest pain/pressure, edema, exercise intolerance, orthopnea and palpitations.  Respiratory: Denied pulmonary symptoms, asthma, pleuritic pain, productive sputum, cough, dyspnea and wheezing.  Gastrointestinal: Denied, gastro-esophageal reflux, melena, nausea and vomiting.  Genitourinary: Denied genitourinary symptoms  including symptomatic vaginal discharge, pelvic relaxation issues, and urinary complaints.  Musculoskeletal: Denied musculoskeletal symptoms, stiffness, swelling, muscle weakness and myalgia.  Dermatologic: Denied dermatology symptoms, rash and scar.  Neurologic: Denied neurology symptoms, dizziness, headache, neck pain and syncope.  Psychiatric: Denied psychiatric symptoms, anxiety and depression.  Endocrine: Denied endocrine symptoms including hot flashes and night sweats.   Meds:   Current Outpatient Prescriptions on File Prior to Visit  Medication Sig Dispense Refill  . ibuprofen (ADVIL,MOTRIN) 800 MG tablet Take 1 tablet (800 mg total) by mouth every 8 (eight) hours as needed. 60 tablet 1  . oxyCODONE-acetaminophen (PERCOCET/ROXICET) 5-325 MG tablet Take 1-2 tablets by mouth every 6 (six) hours as needed (pain scale 4-7). 20 tablet 0  . Prenatal Vit-Fe Fumarate-FA (MULTIVITAMIN-PRENATAL) 27-0.8 MG TABS tablet Take 1 tablet by mouth daily.     . sodium chloride (OCEAN) 0.65 % SOLN nasal spray Place 1 spray into both nostrils as needed for congestion.    . valACYclovir (VALTREX) 500 MG tablet Take 1 tablet (500 mg total) by mouth 2 (two) times daily. 30 tablet 6   No current facility-administered medications on file prior to visit.     Objective:     Vitals:   06/19/16 1356  BP: 105/65  Pulse: 88               Abdomen: Soft.  Non-tender.  No masses.  No HSM.  Incision/s: Intact.  Healing well.  No erythema.  No drainage. Mild edema of incision edge - not firm.      Assessment:    G1P0000 Patient Active Problem List   Diagnosis Date Noted  . Delivery by cesarean section using transverse incision of  lower segment of uterus 06/08/2016  . Poor fetal growth affecting management of mother 05/21/2016  . IUGR (intrauterine growth restriction) affecting care of mother 05/21/2016  . Tobacco use disorder, moderate, in early remission 02/07/2016  . HSV-2 (herpes simplex virus 2)  infection 02/07/2016  . Anxiety disorder 02/07/2016  . Marijuana use, episodic 01/08/2016  . 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 Post-operative state     Patient doing well postop.   Plan:            1.  Wound care The patient has been instructed in wound care. An increase in redness, pain or swelling should prompt her to seek medical attention.  2. Depo-Provera at 6 week postpartum visit 3. Patient may slowly resume normal activities with exception of heavy lifting and intercourse.     F/U  Return in about 5 weeks (around 07/24/2016).  Darlene Miller, M.D. 06/19/2016 2:29 PM

## 2016-07-25 ENCOUNTER — Encounter: Payer: Self-pay | Admitting: Obstetrics and Gynecology

## 2016-07-25 ENCOUNTER — Ambulatory Visit (INDEPENDENT_AMBULATORY_CARE_PROVIDER_SITE_OTHER): Payer: BLUE CROSS/BLUE SHIELD | Admitting: Obstetrics and Gynecology

## 2016-07-25 DIAGNOSIS — Z3009 Encounter for other general counseling and advice on contraception: Secondary | ICD-10-CM

## 2016-07-25 MED ORDER — MEDROXYPROGESTERONE ACETATE 150 MG/ML IM SUSP: 150.0000 mg | INTRAMUSCULAR | 2 refills | Status: DC

## 2016-07-25 NOTE — Progress Notes (Signed)
HPI:      [name] is a 26 y.o. G1P1001 who LMP was Patient's last menstrual period was 07/11/2016 (approximate).  Subjective:   She presents today 6 weeks from cesarean delivery for breech. She is breast-feeding. She has no complaints. She would like to use Depo-Provera for birth control. She has not had intercourse yet.    Hx: The following portions of the patient's history were reviewed and updated as appropriate:             She  has a past medical history of Anxiety; LGSIL (low grade squamous intraepithelial dysplasia); and Medical history non-contributory. She  does not have any pertinent problems on file. She  has a past surgical history that includes Neck surgery and Cesarean section (N/A, 06/08/2016). Her family history includes Hypertension in her mother; Restless legs syndrome in her mother. She  reports that she quit smoking about a year ago. Her smoking use included Cigarettes. She smoked 0.25 packs per day. She has never used smokeless tobacco. She reports that she does not drink alcohol or use drugs. She has No Known Allergies.       Review of Systems:  Review of Systems  Constitutional: Denied constitutional symptoms, night sweats, recent illness, fatigue, fever, insomnia and weight loss.  Eyes: Denied eye symptoms, eye pain, photophobia, vision change and visual disturbance.  Ears/Nose/Throat/Neck: Denied ear, nose, throat or neck symptoms, hearing loss, nasal discharge, sinus congestion and sore throat.  Cardiovascular: Denied cardiovascular symptoms, arrhythmia, chest pain/pressure, edema, exercise intolerance, orthopnea and palpitations.  Respiratory: Denied pulmonary symptoms, asthma, pleuritic pain, productive sputum, cough, dyspnea and wheezing.  Gastrointestinal: Denied, gastro-esophageal reflux, melena, nausea and vomiting.  Genitourinary: Denied genitourinary symptoms including symptomatic vaginal discharge, pelvic relaxation issues, and urinary  complaints.  Musculoskeletal: Denied musculoskeletal symptoms, stiffness, swelling, muscle weakness and myalgia.  Dermatologic: Denied dermatology symptoms, rash and scar.  Neurologic: Denied neurology symptoms, dizziness, headache, neck pain and syncope.  Psychiatric: Denied psychiatric symptoms, anxiety and depression.  Endocrine: Denied endocrine symptoms including hot flashes and night sweats.   Meds:   Current Outpatient Prescriptions on File Prior to Visit  Medication Sig Dispense Refill  . sodium chloride (OCEAN) 0.65 % SOLN nasal spray Place 1 spray into both nostrils as needed for congestion.    . valACYclovir (VALTREX) 500 MG tablet Take 1 tablet (500 mg total) by mouth 2 (two) times daily. 30 tablet 6  . ibuprofen (ADVIL,MOTRIN) 800 MG tablet Take 1 tablet (800 mg total) by mouth every 8 (eight) hours as needed. (Patient not taking: Reported on 07/25/2016) 60 tablet 1  . oxyCODONE-acetaminophen (PERCOCET/ROXICET) 5-325 MG tablet Take 1-2 tablets by mouth every 6 (six) hours as needed (pain scale 4-7). (Patient not taking: Reported on 07/25/2016) 20 tablet 0  . Prenatal Vit-Fe Fumarate-FA (MULTIVITAMIN-PRENATAL) 27-0.8 MG TABS tablet Take 1 tablet by mouth daily.      No current facility-administered medications on file prior to visit.     Objective:     Vitals:   07/25/16 1455  BP: 99/61  Pulse: 80               Abdomen: Soft.  Non-tender.  No masses.  No HSM.  Incision/s: Intact.  Healing well.  No erythema.  No drainage.  Pelvic examination   Pelvic:   Vulva: Normal appearance.  No lesions.  No abnormal scarring.    Vagina: No lesions or abnormalities noted.  Support: Normal pelvic support.  Urethra No masses tenderness or  scarring.  Meatus Normal size without lesions or prolapse.  Cervix: Normal ectropion.  No lesions.  Anus: Normal exam.  No lesions.  Perineum: Normal exam.  No lesions.  Healed well.          Bimanual   Uterus: Normal size.  Non-tender.   Mobile.  AV.  Adnexae: No masses.  Non-tender to palpation.  Cul-de-sac: Negative for abnormality.     Assessment:    G1P1001 Patient Active Problem List   Diagnosis Date Noted  . Delivery by cesarean section using transverse incision of lower segment of uterus 06/08/2016  . Poor fetal growth affecting management of mother 05/21/2016  . IUGR (intrauterine growth restriction) affecting care of mother 05/21/2016  . Tobacco use disorder, moderate, in early remission 02/07/2016  . 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 Anxiety disorder 02/07/2016  . Marijuana use, episodic 01/08/2016  . 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 Postpartum care and examination immediately after delivery   2. Birth control counseling     Patient doing well postpartum without problems.   Plan:            1.  Birth Control I discussed multiple birth control options and methods with the patient.  The risks and benefits of each were reviewed. Patient has chosen Depo-Provera. Orders No orders of the defined types were placed in this encounter.    Meds ordered this encounter  Medications  . medroxyPROGESTERone (DEPO-PROVERA) 150 MG/ML injection    Sig: Inject 1 mL (150 mg total) into the muscle every 3 (three) months.    Dispense:  1 mL    Refill:  2        F/U  Return in about 3 months (around 10/25/2016) for Annual Physical.  Elonda Huskyavid J. Evans, M.D. 07/25/2016 3:16 PM

## 2016-07-30 ENCOUNTER — Ambulatory Visit: Payer: BLUE CROSS/BLUE SHIELD

## 2016-08-03 ENCOUNTER — Ambulatory Visit: Payer: BLUE CROSS/BLUE SHIELD

## 2016-09-24 ENCOUNTER — Telehealth: Payer: Self-pay | Admitting: Obstetrics and Gynecology

## 2016-09-24 NOTE — Telephone Encounter (Signed)
Attempted to contact pt but mailbox has not been set up yet. Pt will need to contact pharmacy about refill. Also Friday 09/28/16 is too early for next depo. It needs to be scheduled between 9/10 and 10/29/16.

## 2016-09-24 NOTE — Telephone Encounter (Signed)
Patient called wanting to schedule her depo injection for this Friday 09/28/16. The patient wanted to ask the nurse if her depo injection will still be at her [facility] pharmacy or if another one needs to be ordered, The patient did not disclose any other information other than wanting a call back. Please advise.

## 2016-09-26 ENCOUNTER — Telehealth: Payer: Self-pay

## 2016-09-26 NOTE — Telephone Encounter (Signed)
Attempted to contact pt- mailbox not set up to leave message.

## 2016-09-28 ENCOUNTER — Ambulatory Visit (INDEPENDENT_AMBULATORY_CARE_PROVIDER_SITE_OTHER): Payer: BLUE CROSS/BLUE SHIELD | Admitting: Obstetrics and Gynecology

## 2016-09-28 VITALS — BP 103/58 | HR 57 | Wt 116.1 lb

## 2016-09-28 DIAGNOSIS — Z3042 Encounter for surveillance of injectable contraceptive: Secondary | ICD-10-CM

## 2016-09-28 LAB — POCT URINE PREGNANCY: PREG TEST UR: NEGATIVE

## 2016-09-28 MED ORDER — MEDROXYPROGESTERONE ACETATE 150 MG/ML IM SUSP
150.0000 mg | Freq: Once | INTRAMUSCULAR | Status: AC
  Administered 2016-09-28: 150 mg via INTRAMUSCULAR

## 2016-09-28 NOTE — Progress Notes (Signed)
Last depo inj: initial UPT: neg Side effects:0 Next Depo- Provera injection due: 11/9-11/23/18 Annual exam due:

## 2016-10-25 ENCOUNTER — Encounter: Payer: BLUE CROSS/BLUE SHIELD | Admitting: Obstetrics and Gynecology

## 2016-11-09 ENCOUNTER — Encounter: Payer: Self-pay | Admitting: Certified Nurse Midwife

## 2016-11-09 ENCOUNTER — Ambulatory Visit (INDEPENDENT_AMBULATORY_CARE_PROVIDER_SITE_OTHER): Payer: BLUE CROSS/BLUE SHIELD | Admitting: Obstetrics and Gynecology

## 2016-11-09 ENCOUNTER — Encounter: Payer: Self-pay | Admitting: Obstetrics and Gynecology

## 2016-11-09 VITALS — BP 112/62 | HR 91 | Ht 62.0 in | Wt 114.5 lb

## 2016-11-09 DIAGNOSIS — R3915 Urgency of urination: Secondary | ICD-10-CM | POA: Diagnosis not present

## 2016-11-09 DIAGNOSIS — R319 Hematuria, unspecified: Secondary | ICD-10-CM | POA: Diagnosis not present

## 2016-11-09 LAB — POCT URINALYSIS DIPSTICK
BILIRUBIN UA: NEGATIVE
GLUCOSE UA: NEGATIVE
Ketones, UA: NEGATIVE
Leukocytes, UA: NEGATIVE
NITRITE UA: NEGATIVE
Protein, UA: NEGATIVE
Spec Grav, UA: 1.01 (ref 1.010–1.025)
Urobilinogen, UA: 0.2 E.U./dL
pH, UA: 6 (ref 5.0–8.0)

## 2016-11-09 NOTE — Progress Notes (Signed)
   GYNECOLOGY CLINIC PROGRESS NOTE  Subjective:    Darlene Miller is a 26 y.o. female who complains of dysuria, frequency and urgency. She has had symptoms for 2 days. Patient also complains of light vaginal bleeding, but thinks it's from her Depo Provera injection as she got several days ago. Patient denies back pain, fever and vaginal discharge. Patient does have a history of recurrent UTI. Patient does not have a history of pyelonephritis.   The following portions of the patient's history were reviewed and updated as appropriate: allergies, current medications, past family history, past medical history, past social history, past surgical history and problem list.  Review of Systems Pertinent items noted in HPI and remainder of comprehensive ROS otherwise negative.    Objective:    BP 112/62   Pulse 91   Ht  (1.575 m)   Wt 114 lb 8 oz (51.9 kg)   LMP 11/08/2016 (Approximate)   Breastfeeding? No   BMI 20.94 kg/m  General appearance: alert and no distress Back: symmetric, no curvature. ROM normal. No CVA tenderness. Abdomen: soft, non-tender; bowel sounds normal; no masses,  no organomegaly Pelvic: deferred  Laboratory:  Results for orders placed or performed in visit on 11/09/16  POCT urinalysis dipstick  Result Value Ref Range   Color, UA yellow    Clarity, UA clear    Glucose, UA neg    Bilirubin, UA neg    Ketones, UA neg    Spec Grav, UA 1.010 1.010 - 1.025   Blood, UA hem trace    pH, UA 6.0 5.0 - 8.0   Protein, UA neg    Urobilinogen, UA 0.2 0.2 or 1.0 E.U./dL   Nitrite, UA neg    Leukocytes, UA Negative Negative      Assessment:    Acute cystitis     Plan:    Medications: antibiotics not indicated at this time.  Given samples of Uribel, and advised on Azo if symptoms continue after samples completed.. Maintain adequate hydration. Urine culture sent. Follow up if symptoms not improving, and as needed.     Hildred Laser, MD [facility]

## 2016-11-09 NOTE — Patient Instructions (Addendum)

## 2016-11-11 LAB — URINE CULTURE

## 2017-01-01 ENCOUNTER — Encounter: Payer: BLUE CROSS/BLUE SHIELD | Admitting: Obstetrics and Gynecology

## 2017-02-12 ENCOUNTER — Encounter: Payer: Self-pay | Admitting: Obstetrics and Gynecology

## 2017-02-21 ENCOUNTER — Encounter: Payer: BLUE CROSS/BLUE SHIELD | Admitting: Obstetrics and Gynecology

## 2017-03-07 ENCOUNTER — Encounter: Payer: Self-pay | Admitting: Obstetrics and Gynecology

## 2017-03-07 ENCOUNTER — Ambulatory Visit (INDEPENDENT_AMBULATORY_CARE_PROVIDER_SITE_OTHER): Payer: BLUE CROSS/BLUE SHIELD | Admitting: Obstetrics and Gynecology

## 2017-03-07 VITALS — BP 118/71 | HR 53 | Wt 112.8 lb

## 2017-03-07 DIAGNOSIS — Z3202 Encounter for pregnancy test, result negative: Secondary | ICD-10-CM | POA: Diagnosis not present

## 2017-03-07 DIAGNOSIS — Z3009 Encounter for other general counseling and advice on contraception: Secondary | ICD-10-CM

## 2017-03-07 DIAGNOSIS — Z3042 Encounter for surveillance of injectable contraceptive: Secondary | ICD-10-CM | POA: Diagnosis not present

## 2017-03-07 LAB — POCT URINE PREGNANCY: Preg Test, Ur: NEGATIVE

## 2017-03-07 MED ORDER — MEDROXYPROGESTERONE ACETATE 150 MG/ML IM SUSP
150.0000 mg | Freq: Once | INTRAMUSCULAR | Status: AC
  Administered 2017-03-07: 150 mg via INTRAMUSCULAR

## 2017-03-07 MED ORDER — MEDROXYPROGESTERONE ACETATE 150 MG/ML IM SUSP: 150.0000 mg | INTRAMUSCULAR | 4 refills | Status: DC

## 2017-03-07 NOTE — Progress Notes (Signed)
    GYNECOLOGY CLINIC PROGRESS NOTE Subjective:    Darlene Miller is a 27 y.o. 131P1001 female who presents for contraception management.  She desires to resume her Depo Provera.  Last injection was approximately August 2018.  Notes that she missed her November appointment and her life has been hectic and she never found the time to reschedule until now. The patient has no complaints today. The patient is sexually active. Pertinent past medical history: none.  Menstrual History: OB History    Gravida Para Term Preterm AB Living   1 1 1  0 0 1   SAB TAB Ectopic Multiple Live Births   0 0 0 0 1      Obstetric Comments   G1- IUGR in pregnancy      Menarche age: [age] No LMP recorded. Still amenorrheic from prior Depo Provera use.     The following portions of the patient's history were reviewed and updated as appropriate: allergies, current medications, past family history, past medical history, past social history, past surgical history and problem list.  Review of Systems A comprehensive review of systems was negative.   Objective:    BP 118/71   Pulse (!) 53   Wt 112 lb 12.8 oz (51.2 kg)   BMI 20.63 kg/m  General appearance: alert and no distress  Remainder of exam deferred.   Assessment:    27 y.o., resuming Depo-Provera injections, no contraindications.   Plan:   - Contraception: Depo-Provera injections. Injection given today.  Strongly stressed the importance of routine follow up with Depo provera injection appointments.  Also given info on [facility].  - F/u in 3 months for next injection.

## 2017-03-07 NOTE — Progress Notes (Signed)
pregnancy test done result/neg

## 2017-03-07 NOTE — Patient Instructions (Signed)

## 2017-03-27 ENCOUNTER — Encounter: Payer: BLUE CROSS/BLUE SHIELD | Admitting: Obstetrics and Gynecology

## 2017-05-24 ENCOUNTER — Ambulatory Visit: Payer: BLUE CROSS/BLUE SHIELD

## 2017-05-27 ENCOUNTER — Ambulatory Visit: Payer: BLUE CROSS/BLUE SHIELD

## 2017-05-31 ENCOUNTER — Ambulatory Visit: Payer: BLUE CROSS/BLUE SHIELD

## 2017-06-04 ENCOUNTER — Ambulatory Visit (INDEPENDENT_AMBULATORY_CARE_PROVIDER_SITE_OTHER): Payer: BLUE CROSS/BLUE SHIELD | Admitting: Obstetrics and Gynecology

## 2017-06-04 VITALS — BP 93/57 | HR 64 | Wt 111.4 lb

## 2017-06-04 DIAGNOSIS — Z3042 Encounter for surveillance of injectable contraceptive: Secondary | ICD-10-CM

## 2017-06-04 MED ORDER — MEDROXYPROGESTERONE ACETATE 150 MG/ML IM SUSP
150.0000 mg | Freq: Once | INTRAMUSCULAR | Status: AC
  Administered 2017-06-04: 150 mg via INTRAMUSCULAR

## 2017-06-04 NOTE — Progress Notes (Signed)
I have reviewed the record and concur with patient management and plan.  Alaze Garverick, MD [facility]     

## 2017-06-04 NOTE — Progress Notes (Signed)
Date last pap: na. Last Depo-Provera: 03/06/17 Side Effects if any: .na Serum HCG indicated? .na Depo-Provera 150 mg IM given by: .Rosine Beat, CMA Next appointment due .July 16-30th  BP (!) 93/57   Pulse 64   Wt 111 lb 6.4 oz (50.5 kg)   BMI 20.38 kg/m

## 2017-06-07 ENCOUNTER — Ambulatory Visit: Payer: BLUE CROSS/BLUE SHIELD

## 2017-08-23 ENCOUNTER — Ambulatory Visit: Payer: BLUE CROSS/BLUE SHIELD

## 2017-08-30 ENCOUNTER — Ambulatory Visit: Payer: BLUE CROSS/BLUE SHIELD

## 2017-09-03 ENCOUNTER — Ambulatory Visit (INDEPENDENT_AMBULATORY_CARE_PROVIDER_SITE_OTHER): Payer: BLUE CROSS/BLUE SHIELD | Admitting: Obstetrics and Gynecology

## 2017-09-03 VITALS — BP 100/63 | HR 80 | Ht 62.0 in | Wt 110.8 lb

## 2017-09-03 DIAGNOSIS — Z3042 Encounter for surveillance of injectable contraceptive: Secondary | ICD-10-CM

## 2017-09-03 MED ORDER — MEDROXYPROGESTERONE ACETATE 150 MG/ML IM SUSP
150.0000 mg | Freq: Once | INTRAMUSCULAR | Status: AC
  Administered 2017-09-03: 150 mg via INTRAMUSCULAR

## 2017-09-03 NOTE — Progress Notes (Signed)
I have reviewed the record and concur with patient management and plan.  Ameliah Baskins, MD [facility]     

## 2017-09-03 NOTE — Progress Notes (Signed)
Date last pap: n/a Last Depo-Provera: 06/04/2017. Side Effects if any: none Serum HCG indicated? n/a Depo-Provera 150 mg IM given by: CM Next appointment due 10/15------10/29.  Pt missed AE in 10/2016. Advised pt to f/u for AE and depo inj NV.   BP 100/63   Pulse 80   Ht 5\' 2"  (1.575 m)   Wt 110 lb 12.8 oz (50.3 kg)   LMP  (LMP Unknown)   Breastfeeding? No   BMI 20.27 kg/m

## 2017-11-19 ENCOUNTER — Ambulatory Visit: Payer: BLUE CROSS/BLUE SHIELD

## 2017-11-27 NOTE — Progress Notes (Signed)
Date last pap: 11/25/15. Last Depo-Provera: 09/03/17. Side Effects if any: none. Serum HCG indicated? No. Depo-Provera 150 mg IM given by: FH. Next appointment due Jan 10-Feb 28, 2018.   BP 102/66   Pulse 89   Ht 5' (1.524 m)   Wt 114 lb (51.7 kg)   LMP 11/27/2017   BMI 22.26 kg/m

## 2017-11-29 ENCOUNTER — Ambulatory Visit (INDEPENDENT_AMBULATORY_CARE_PROVIDER_SITE_OTHER): Payer: Medicaid Other | Admitting: Obstetrics and Gynecology

## 2017-11-29 VITALS — BP 102/66 | HR 89 | Ht 60.0 in | Wt 114.0 lb

## 2017-11-29 DIAGNOSIS — Z3042 Encounter for surveillance of injectable contraceptive: Secondary | ICD-10-CM

## 2017-11-29 MED ORDER — MEDROXYPROGESTERONE ACETATE 150 MG/ML IM SUSP
150.0000 mg | Freq: Once | INTRAMUSCULAR | Status: AC
  Administered 2017-11-29: 150 mg via INTRAMUSCULAR

## 2017-12-16 NOTE — Progress Notes (Signed)
I have reviewed the record and concur with patient management and plan.  Solina Heron, MD [facility]     

## 2018-01-13 ENCOUNTER — Other Ambulatory Visit: Payer: Self-pay

## 2018-01-13 ENCOUNTER — Emergency Department
Admission: EM | Admit: 2018-01-13 | Discharge: 2018-01-13 | Disposition: A | Discharge status: Home / Self Care | Payer: Medicaid Other | Attending: Student in an Organized Health Care Education/Training Program | Admitting: Student in an Organized Health Care Education/Training Program

## 2018-01-13 DIAGNOSIS — R300 Dysuria: Secondary | ICD-10-CM

## 2018-01-13 DIAGNOSIS — F17210 Nicotine dependence, cigarettes, uncomplicated: Secondary | ICD-10-CM | POA: Insufficient documentation

## 2018-01-13 DIAGNOSIS — R809 Proteinuria, unspecified: Secondary | ICD-10-CM | POA: Diagnosis not present

## 2018-01-13 LAB — URINALYSIS, COMPLETE (UACMP) WITH MICROSCOPIC
BILIRUBIN URINE: NEGATIVE
Bacteria, UA: NONE SEEN
Bacteria, UA: NONE SEEN
Bilirubin Urine: NEGATIVE
Glucose, UA: NEGATIVE mg/dL
Glucose, UA: NEGATIVE mg/dL
HGB URINE DIPSTICK: NEGATIVE
Hgb urine dipstick: NEGATIVE
Ketones, ur: 5 mg/dL — AB
Ketones, ur: NEGATIVE mg/dL
Leukocytes, UA: NEGATIVE
NITRITE: NEGATIVE
Nitrite: NEGATIVE
Protein, ur: 100 mg/dL — AB
Specific Gravity, Urine: 1.034 — ABNORMAL HIGH (ref 1.005–1.030)
Specific Gravity, Urine: 1.023 (ref 1.005–1.030)
pH: 6 (ref 5.0–8.0)
pH: 6 (ref 5.0–8.0)

## 2018-01-13 LAB — GLUCOSE, CAPILLARY: Glucose-Capillary: 99 mg/dL (ref 70–99)

## 2018-01-13 LAB — GROUP A STREP BY PCR: Group A Strep by PCR: NOT DETECTED

## 2018-01-13 LAB — POCT PREGNANCY, URINE: PREG TEST UR: NEGATIVE

## 2018-01-13 MED ORDER — PHENAZOPYRIDINE HCL 200 MG PO TABS: 200.0000 mg | ORAL_TABLET | Freq: Three times a day (TID) | ORAL | 0 refills | Status: DC | PRN

## 2018-01-13 NOTE — Discharge Instructions (Addendum)
Follow-up with your regular doctor or Dr. Liliane ShiWinter for reevaluation of your urine and 1 week.  Return to the emergency department if your symptoms are worsening.

## 2018-01-13 NOTE — ED Triage Notes (Signed)
Pt c/o painful urination with increased urgency since yesterday. Denies any vaginal discharge or other sx at this time.

## 2018-01-13 NOTE — ED Provider Notes (Signed)
[facility] Emergency Department Provider Note  ____________________________________________   First MD Initiated Contact with Patient 01/13/18 1027     (approximate)  I have reviewed the triage vital signs and the nursing notes.   HISTORY  Chief Complaint Urinary Tract Infection    HPI Darlene Miller is a 27 y.o. female [place] emergency department complaining of dysuria for the past couple days.  She denies any fever or chills.  No vomiting or diarrhea.  No vaginal discharge.  No concerns for STD.  Patient states she did have sore throat a couple of weeks ago.  It resolved on its own.    Past Medical History:  Diagnosis Date  . Anxiety   . 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 Tobacco use disorder, moderate, in early remission 02/07/2016  . 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 Anxiety disorder 02/07/2016  . Marijuana use, episodic 01/08/2016  . Papanicolaou smear of cervix with low grade squamous intraepithelial lesion (LGSIL) 02/23/2015    Past Surgical History:  Procedure Laterality Date  . CESAREAN SECTION N/A 06/08/2016   Procedure: CESAREAN SECTION;  Surgeon: Linzie Collin, MD;  Location: [facility] ORS;  Service: Obstetrics;  Laterality: N/A;  Female born @ [birth year] Weight: 5lbs  Apgars: 8/9  . NECK SURGERY     mass on neck- benign    Prior to Admission medications   Medication Sig Start Date End Date Taking? Authorizing Provider  medroxyPROGESTERone (DEPO-PROVERA) 150 MG/ML injection Inject 1 mL (150 mg total) into the muscle every 3 (three) months. 03/07/17   Hildred Laser, MD  phenazopyridine (PYRIDIUM) 200 MG tablet Take 1 tablet (200 mg total) by mouth 3 (three) times daily as needed for pain. 01/13/18   Fisher, Roselyn Bering, PA-C  valACYclovir (VALTREX) 500 MG tablet Take 1 tablet (500 mg total) by mouth 2 (two) times daily. 05/31/16    Linzie Collin, MD    Allergies Patient has no known allergies.  Family History  Problem Relation Age of Onset  . Hypertension Mother   . Restless legs syndrome Mother   . COPD Mother   . Cancer Neg Hx   . Diabetes Neg Hx   . Heart disease Neg Hx     Social History Social History   Tobacco Use  . Smoking status: Light Tobacco Smoker    Packs/day: 0.25    Types: Cigarettes    Last attempt to quit: 08/06/2015    Years since quitting: 2.4  . Smokeless tobacco: Never Used  Substance Use Topics  . Alcohol use: Yes    Comment: occas  . Drug use: No    Review of Systems  Constitutional: No fever/chills Eyes: No visual changes. ENT: No sore throat. Respiratory: Denies cough Genitourinary:  positive for dysuria. Musculoskeletal: Negative for back pain. Skin: Negative for rash.    ____________________________________________   PHYSICAL EXAM:  VITAL SIGNS: ED Triage Vitals  Enc Vitals Group     BP 01/13/18 1008 98/76     Pulse Rate 01/13/18 1008 63     Resp 01/13/18 1008 18     Temp 01/13/18 1008 98.5 F (36.9 C)     Temp Source 01/13/18 1008 Oral     SpO2 01/13/18 1008 99 %     Weight 01/13/18 1009 114 lb (51.7 kg)     Height 01/13/18 1009 5' (1.524 m)  Head Circumference --      Peak Flow --      Pain Score --      Pain Loc --      Pain Edu? --      Excl. in GC? --     Constitutional: Alert and oriented. Well appearing and in no acute distress. Eyes: Conjunctivae are normal.  Head: Atraumatic. Nose: No congestion/rhinnorhea. Mouth/Throat: Mucous membranes are moist.  Throat appears normal Neck:  supple no lymphadenopathy noted Cardiovascular: Normal rate, regular rhythm. Heart sounds are normal Respiratory: Normal respiratory effort.  No retractions, lungs c t a  Abdomen: Soft, nontender, no CVA tenderness noted  GU: deferred Musculoskeletal: FROM all extremities, warm and well perfused Neurologic:  Normal speech and language.  Skin:  Skin  is warm, dry and intact. No rash noted. Psychiatric: Mood and affect are normal. Speech and behavior are normal.  ____________________________________________   LABS (all labs ordered are listed, but only abnormal results are displayed)  Labs Reviewed  URINALYSIS, COMPLETE (UACMP) WITH MICROSCOPIC - Abnormal; Notable for the following components:      Result Value   Color, Urine AMBER (*)    APPearance HAZY (*)    Specific Gravity, Urine 1.034 (*)    Ketones, ur 5 (*)    Protein, ur >=300 (*)    Leukocytes, UA TRACE (*)    All other components within normal limits  URINALYSIS, COMPLETE (UACMP) WITH MICROSCOPIC - Abnormal; Notable for the following components:   Color, Urine YELLOW (*)    APPearance CLEAR (*)    Protein, ur 100 (*)    All other components within normal limits  GROUP A STREP BY PCR  URINE CULTURE  GLUCOSE, CAPILLARY  POC URINE PREG, ED  POCT PREGNANCY, URINE  CBG MONITORING, ED   ____________________________________________   ____________________________________________  RADIOLOGY    ____________________________________________   PROCEDURES  Procedure(s) performed: No  Procedures    ____________________________________________   INITIAL IMPRESSION / ASSESSMENT AND PLAN / ED COURSE  Pertinent labs & imaging results that were available during my care of the patient were reviewed by me and considered in my medical decision making (see chart for details).   Patient is 27 year old female presents emergency department complaint of dysuria.  Physical exam is unremarkable.  The first UA had greater than 300 protein noted.  No leuks or bacteria. Due to the patient's previous sore throat and the proteinuria a strep test was ordered. Point-of-care glucose was normal Strep test is negative Repeat UA shows greater than 100 protein in the urine.  Explained the findings to the patient.  She is to follow-up with her regular doctor for repeat urine in  1 week.  Return emergency department worsening.  Explained to her that she may need a referral to nephrology.  Could also start with urology.  She states she understands and will comply.  She is discharged in stable condition.  A urine culture was added.     As part of my medical decision making, I reviewed the following data within the electronic MEDICAL RECORD NUMBER Nursing notes reviewed and incorporated, Labs reviewed see above, Old chart reviewed, Notes from prior ED visits and [place] Controlled Substance Database  ____________________________________________   FINAL CLINICAL IMPRESSION(S) / ED DIAGNOSES  Final diagnoses:  Proteinuria, unspecified type  Dysuria      NEW MEDICATIONS STARTED DURING THIS VISIT:  Discharge Medication List as of 01/13/2018 12:51 PM       Note:  This document was  prepared using Conservation officer, historic buildings and may include unintentional dictation errors.    Faythe Ghee, PA-C 01/13/18 1534    Willy Eddy, MD 01/13/18 [phone]

## 2018-01-13 NOTE — ED Notes (Signed)
See triage note  States she developed some urinary sx's over the past couple of days  Worse this am

## 2018-01-15 LAB — URINE CULTURE

## 2018-01-21 ENCOUNTER — Encounter: Payer: Self-pay | Admitting: Obstetrics and Gynecology

## 2018-01-21 ENCOUNTER — Ambulatory Visit (INDEPENDENT_AMBULATORY_CARE_PROVIDER_SITE_OTHER): Payer: Medicaid Other | Admitting: Obstetrics and Gynecology

## 2018-01-21 VITALS — BP 105/72 | HR 70 | Ht 60.0 in | Wt 115.3 lb

## 2018-01-21 DIAGNOSIS — R800 Isolated proteinuria: Secondary | ICD-10-CM | POA: Diagnosis not present

## 2018-01-21 DIAGNOSIS — R399 Unspecified symptoms and signs involving the genitourinary system: Secondary | ICD-10-CM | POA: Diagnosis not present

## 2018-01-21 LAB — POCT URINALYSIS DIPSTICK
Bilirubin, UA: NEGATIVE
Blood, UA: NEGATIVE
Glucose, UA: NEGATIVE
Ketones, UA: NEGATIVE
Leukocytes, UA: NEGATIVE
Nitrite, UA: NEGATIVE
Protein, UA: NEGATIVE
UROBILINOGEN UA: 0.2 U/dL
pH, UA: 8 (ref 5.0–8.0)

## 2018-01-21 NOTE — Progress Notes (Signed)
Pt is present today for a follow up after an ER visit. Pt stated that she was having symptoms like a uti, pain with urination, no burning, itcing or back pains. Pt stated that at the ED they stated that she had elevated protien levels in her urine. urinalysis was completed today. Recorded results.

## 2018-01-21 NOTE — Progress Notes (Signed)
    GYNECOLOGY PROGRESS NOTE  Subjective:    Patient ID: Steele BergAngeline N Wehling, female    DOB: [birth year], 27 y.o.   MRN: [id number]  HPI  Patient is a 27 y.o. [id number] female who presents for f/u from the Emergency Room for complaints of UTI symptoms. Patient states while she was seen in the ER, it was noted that she had significant proteinuria.  Review of patient's labs from 12/9 ER visit notes +3 proteinuria, followed by repeat 1-2 hours later with +1 proteinuria.  No significant findings for diagnosis of UTI was found.  Patient states that they instructed her to f/u with her doctor regarding the proteinuria.  She notes that her UTI symptoms have subsided as she has been increasing her fluid intake.  Was more concerned about the protein in her urine.   The following portions of the patient's history were reviewed and updated as appropriate: allergies, current medications, past family history, past medical history, past social history, past surgical history and problem list.  Review of Systems Pertinent items noted in HPI and remainder of comprehensive ROS otherwise negative.   Objective:   Blood pressure 105/72, pulse 70, height 5' (1.524 m), weight 115 lb 4.8 oz (52.3 kg), not currently breastfeeding. General appearance: alert and no distress Abdomen: soft, non-tender; bowel sounds normal; no masses,  no organomegaly Back: symmetric, no curvature. ROM normal. No CVA tenderness.   Labs:  Results for orders placed or performed in visit on 01/21/18  POCT urinalysis dipstick  Result Value Ref Range   Color, UA yellow    Clarity, UA clear    Glucose, UA Negative Negative   Bilirubin, UA neg    Ketones, UA neg    Spec Grav, UA <=1.005 (A) 1.010 - 1.025   Blood, UA neg    pH, UA 8.0 5.0 - 8.0   Protein, UA Negative Negative   Urobilinogen, UA 0.2 0.2 or 1.0 E.U./dL   Nitrite, UA neg    Leukocytes, UA Negative Negative   Appearance yellow    Odor      Assessment:   Proteinuria UTI  symptoms  Plan:   - Proteinuria has resolved based on today's UA.  Given reassurance. Discussed common causes of proteinuria.  Advised to continue adequate hydration.  - UTI symptoms have resolved. Continue to encourage hydration.  - Return to clinic for any scheduled appointments or for any gynecologic concerns as needed.    Hildred Laserherry, Maleak Brazzel, MD [facility]

## 2018-02-14 ENCOUNTER — Ambulatory Visit: Payer: Medicaid Other

## 2018-02-19 ENCOUNTER — Telehealth: Payer: Self-pay | Admitting: Obstetrics and Gynecology

## 2018-02-19 DIAGNOSIS — Z3009 Encounter for other general counseling and advice on contraception: Secondary | ICD-10-CM

## 2018-02-19 NOTE — Telephone Encounter (Signed)
The patient needs a DEPO refill for her Monday appointment.  She uses [facility].  She is asking for a call to let her know it is available.  Please advise, thanks.

## 2018-02-20 MED ORDER — MEDROXYPROGESTERONE ACETATE 150 MG/ML IM SUSP: 150.0000 mg | INTRAMUSCULAR | 0 refills | Status: DC

## 2018-02-20 NOTE — Telephone Encounter (Signed)
Pt was called and informed that her depo had been refilled and sent to the [facility] pharmacy on [address]. Pt was informed that she needed to make an appointment in the next 3 months before anymore refills would be given on her depo. Pt made an appointment to be seen in March for her annual exam.

## 2018-02-24 ENCOUNTER — Ambulatory Visit: Payer: Medicaid Other

## 2018-03-03 ENCOUNTER — Ambulatory Visit (INDEPENDENT_AMBULATORY_CARE_PROVIDER_SITE_OTHER): Payer: Self-pay | Admitting: Obstetrics and Gynecology

## 2018-03-03 VITALS — BP 106/57 | HR 75 | Ht 60.0 in | Wt 112.4 lb

## 2018-03-03 DIAGNOSIS — Z3202 Encounter for pregnancy test, result negative: Secondary | ICD-10-CM

## 2018-03-03 DIAGNOSIS — Z3042 Encounter for surveillance of injectable contraceptive: Secondary | ICD-10-CM

## 2018-03-03 LAB — POCT URINE PREGNANCY: Preg Test, Ur: NEGATIVE

## 2018-03-03 MED ORDER — MEDROXYPROGESTERONE ACETATE 150 MG/ML IM SUSP
150.0000 mg | Freq: Once | INTRAMUSCULAR | Status: AC
  Administered 2018-03-03: 150 mg via INTRAMUSCULAR

## 2018-03-03 NOTE — Progress Notes (Signed)
Last depo inj:11/29/17 UPT:neg Side effects:none Next Depo- Provera injection due: 4/14-4/28/20 Annual exam due:11/25/18

## 2018-03-05 ENCOUNTER — Encounter: Payer: Self-pay | Admitting: Obstetrics and Gynecology

## 2018-04-21 ENCOUNTER — Telehealth: Payer: Self-pay

## 2018-04-21 NOTE — Telephone Encounter (Signed)
Pt called to see if she could come in tomorrow at 9am instead of 3pm on the 04/23/18. Pt stated that she was unable to due to having to take off from work to stay at home with her daughter. Pt also stated that she will not be able to come to her 04/23/18 appointment and needed to cancel the appointment.

## 2018-04-23 ENCOUNTER — Encounter: Payer: Self-pay | Admitting: Obstetrics and Gynecology

## 2018-05-20 ENCOUNTER — Telehealth: Payer: Self-pay

## 2018-05-20 NOTE — Telephone Encounter (Signed)
covi

## 2018-05-21 ENCOUNTER — Ambulatory Visit: Payer: Medicaid Other

## 2018-05-26 ENCOUNTER — Ambulatory Visit: Payer: Medicaid Other

## 2018-06-02 ENCOUNTER — Other Ambulatory Visit: Payer: Self-pay

## 2018-06-02 ENCOUNTER — Other Ambulatory Visit: Payer: Self-pay | Admitting: Obstetrics and Gynecology

## 2018-06-02 ENCOUNTER — Telehealth: Payer: Self-pay | Admitting: Obstetrics and Gynecology

## 2018-06-02 ENCOUNTER — Ambulatory Visit (INDEPENDENT_AMBULATORY_CARE_PROVIDER_SITE_OTHER): Payer: Self-pay | Admitting: Obstetrics and Gynecology

## 2018-06-02 VITALS — BP 104/64 | HR 69 | Ht 60.0 in | Wt 120.5 lb

## 2018-06-02 DIAGNOSIS — Z3042 Encounter for surveillance of injectable contraceptive: Secondary | ICD-10-CM

## 2018-06-02 DIAGNOSIS — Z3009 Encounter for other general counseling and advice on contraception: Secondary | ICD-10-CM

## 2018-06-02 MED ORDER — MEDROXYPROGESTERONE ACETATE 150 MG/ML IM SUSP: 150.0000 mg | INTRAMUSCULAR | 0 refills | Status: DC

## 2018-06-02 MED ORDER — MEDROXYPROGESTERONE ACETATE 150 MG/ML IM SUSP
150.0000 mg | Freq: Once | INTRAMUSCULAR | Status: AC
  Administered 2018-06-02: 150 mg via INTRAMUSCULAR

## 2018-06-02 NOTE — Progress Notes (Signed)
Date last pap: 11/25/2015. Last Depo-Provera: 03/03/18. Side Effects if any: none Serum HCG indicated? N/a. Depo-Provera 150 mg IM given by: [facility] Next appointment due July 14-27, 2020.

## 2018-06-02 NOTE — Telephone Encounter (Signed)
Patient needs a refill on Depo. She is scheduled for an appointment at 4 today. Thanks

## 2018-06-02 NOTE — Telephone Encounter (Signed)
Pt was called and informed that her medication depo was sent to her pharmacy. Pt was also informed that she needed to make an appointment with [facility] soon due to her last pap was done in 2017. Pt stated that she would make that appointment after her depo injection today.

## 2018-08-15 ENCOUNTER — Other Ambulatory Visit: Payer: Self-pay | Admitting: Surgical

## 2018-08-15 DIAGNOSIS — Z3009 Encounter for other general counseling and advice on contraception: Secondary | ICD-10-CM

## 2018-08-15 MED ORDER — MEDROXYPROGESTERONE ACETATE 150 MG/ML IM SUSP: 150.0000 mg | INTRAMUSCULAR | 0 refills | Status: DC

## 2018-08-18 ENCOUNTER — Encounter: Payer: Self-pay | Admitting: Obstetrics and Gynecology

## 2018-08-18 ENCOUNTER — Other Ambulatory Visit: Payer: Self-pay

## 2018-08-18 ENCOUNTER — Ambulatory Visit (INDEPENDENT_AMBULATORY_CARE_PROVIDER_SITE_OTHER): Payer: BC Managed Care – PPO | Admitting: Obstetrics and Gynecology

## 2018-08-18 VITALS — BP 110/72 | HR 89 | Ht 60.0 in | Wt 120.1 lb

## 2018-08-18 DIAGNOSIS — Z3042 Encounter for surveillance of injectable contraceptive: Secondary | ICD-10-CM | POA: Diagnosis not present

## 2018-08-18 MED ORDER — MEDROXYPROGESTERONE ACETATE 150 MG/ML IM SUSP
150.0000 mg | Freq: Once | INTRAMUSCULAR | Status: AC
  Administered 2018-08-18: 16:00:00 150 mg via INTRAMUSCULAR

## 2018-08-18 NOTE — Progress Notes (Signed)
Date last pap:NA Last Depo-Provera: 06/02/2018 Side Effects if any: NA Serum HCG indicated? NA Depo-Provera 150 mg IM given by: Robie Ridge CMA Next appointment due 11/03/18-11/17/18  Today's Vitals   08/18/18 1606  BP: 110/72  Pulse: 89  Weight: 120 lb 1.6 oz (54.5 kg)  Height: 5' (1.524 m)   Body mass index is 23.46 kg/m.

## 2018-09-10 ENCOUNTER — Encounter: Payer: Self-pay | Admitting: Obstetrics and Gynecology

## 2018-11-03 ENCOUNTER — Ambulatory Visit (INDEPENDENT_AMBULATORY_CARE_PROVIDER_SITE_OTHER): Payer: BC Managed Care – PPO | Admitting: Obstetrics and Gynecology

## 2018-11-03 ENCOUNTER — Other Ambulatory Visit: Payer: Self-pay | Admitting: Obstetrics and Gynecology

## 2018-11-03 ENCOUNTER — Other Ambulatory Visit: Payer: Self-pay

## 2018-11-03 VITALS — BP 112/72 | HR 92 | Wt 123.8 lb

## 2018-11-03 DIAGNOSIS — Z3042 Encounter for surveillance of injectable contraceptive: Secondary | ICD-10-CM

## 2018-11-03 DIAGNOSIS — Z3009 Encounter for other general counseling and advice on contraception: Secondary | ICD-10-CM

## 2018-11-03 MED ORDER — MEDROXYPROGESTERONE ACETATE 150 MG/ML IM SUSP
150.0000 mg | Freq: Once | INTRAMUSCULAR | Status: AC
  Administered 2018-11-03: 150 mg via INTRAMUSCULAR

## 2018-11-03 NOTE — Progress Notes (Signed)
Date last pap: NA Last Depo-Provera: 08/18/2018 Side Effects if any: NA Serum HCG indicated? NA Depo-Provera 150 mg IM given by: [facility] CMA Next appointment due 01/19/2019-02/02/2019  Today's Vitals   11/03/18 1558  BP: 112/72  Pulse: 92  Weight: 123 lb 12.8 oz (56.2 kg)   Body mass index is 24.18 kg/m.

## 2018-12-19 ENCOUNTER — Encounter: Payer: Self-pay | Admitting: Obstetrics and Gynecology

## 2018-12-19 ENCOUNTER — Ambulatory Visit (INDEPENDENT_AMBULATORY_CARE_PROVIDER_SITE_OTHER): Payer: BC Managed Care – PPO | Admitting: Obstetrics and Gynecology

## 2018-12-19 ENCOUNTER — Other Ambulatory Visit: Payer: Self-pay

## 2018-12-19 ENCOUNTER — Other Ambulatory Visit (HOSPITAL_COMMUNITY)
Admission: RE | Admit: 2018-12-19 | Discharge: 2018-12-19 | Disposition: A | Discharge status: Home / Self Care | Payer: BC Managed Care – PPO | Source: Ambulatory Visit | Attending: Obstetrics and Gynecology | Admitting: Obstetrics and Gynecology

## 2018-12-19 VITALS — BP 101/67 | HR 82 | Ht 60.0 in | Wt 120.2 lb

## 2018-12-19 DIAGNOSIS — Z124 Encounter for screening for malignant neoplasm of cervix: Secondary | ICD-10-CM | POA: Insufficient documentation

## 2018-12-19 DIAGNOSIS — Z01419 Encounter for gynecological examination (general) (routine) without abnormal findings: Secondary | ICD-10-CM

## 2018-12-19 DIAGNOSIS — Z113 Encounter for screening for infections with a predominantly sexual mode of transmission: Secondary | ICD-10-CM | POA: Diagnosis not present

## 2018-12-19 DIAGNOSIS — Z3042 Encounter for surveillance of injectable contraceptive: Secondary | ICD-10-CM | POA: Diagnosis not present

## 2018-12-19 NOTE — Progress Notes (Signed)
Pt present for annual exam. Pt stated that she was doing well no problems. Pt had flu vaccine at work.

## 2018-12-19 NOTE — Patient Instructions (Addendum)
Health Maintenance, Female Adopting a healthy lifestyle and getting preventive care are important in promoting health and wellness. Ask your health care provider about:  The right schedule for you to have regular tests and exams.  Things you can do on your own to prevent diseases and keep yourself healthy. What should I know about diet, weight, and exercise? Eat a healthy diet   Eat a diet that includes plenty of vegetables, fruits, low-fat dairy products, and lean protein.  Do not eat a lot of foods that are high in solid fats, added sugars, or sodium. Maintain a healthy weight Body mass index (BMI) is used to identify weight problems. It estimates body fat based on height and weight. Your health care provider can help determine your BMI and help you achieve or maintain a healthy weight. Get regular exercise Get regular exercise. This is one of the most important things you can do for your health. Most adults should:  Exercise for at least 150 minutes each week. The exercise should increase your heart rate and make you sweat (moderate-intensity exercise).  Do strengthening exercises at least twice a week. This is in addition to the moderate-intensity exercise.  Spend less time sitting. Even light physical activity can be beneficial. Watch cholesterol and blood lipids Have your blood tested for lipids and cholesterol at 30 years of age, then have this test every 5 years. Have your cholesterol levels checked more often if:  Your lipid or cholesterol levels are high.  You are older than 30 years of age.  You are at high risk for heart disease. What should I know about cancer screening? Depending on your health history and family history, you may need to have cancer screening at various ages. This may include screening for:  Breast cancer.  Cervical cancer.  Colorectal cancer.  Skin cancer.  Lung cancer. What should I know about heart disease, diabetes, and high blood  pressure? Blood pressure and heart disease  High blood pressure causes heart disease and increases the risk of stroke. This is more likely to develop in people who have high blood pressure readings, are of African descent, or are overweight.  Have your blood pressure checked: ? Every 3-5 years if you are 18-39 years of age. ? Every year if you are 40 years old or older. Diabetes Have regular diabetes screenings. This checks your fasting blood sugar level. Have the screening done:  Once every three years after age 40 if you are at a normal weight and have a low risk for diabetes.  More often and at a younger age if you are overweight or have a high risk for diabetes. What should I know about preventing infection? Hepatitis B If you have a higher risk for hepatitis B, you should be screened for this virus. Talk with your health care provider to find out if you are at risk for hepatitis B infection. Hepatitis C Testing is recommended for:  Everyone born from 1945 through 1965.  Anyone with known risk factors for hepatitis C. Sexually transmitted infections (STIs)  Get screened for STIs, including gonorrhea and chlamydia, if: ? You are sexually active and are younger than 30 years of age. ? You are older than 30 years of age and your health care provider tells you that you are at risk for this type of infection. ? Your sexual activity has changed since you were last screened, and you are at increased risk for chlamydia or gonorrhea. Ask your health care provider if   you are at risk.  Ask your health care provider about whether you are at high risk for HIV. Your health care provider may recommend a prescription medicine to help prevent HIV infection. If you choose to take medicine to prevent HIV, you should first get tested for HIV. You should then be tested every 3 months for as long as you are taking the medicine. Pregnancy  If you are about to stop having your period (premenopausal) and  you may become pregnant, seek counseling before you get pregnant.  Take 400 to 800 micrograms (mcg) of folic acid every day if you become pregnant.  Ask for birth control (contraception) if you want to prevent pregnancy. Osteoporosis and menopause Osteoporosis is a disease in which the bones lose minerals and strength with aging. This can result in bone fractures. If you are 65 years old or older, or if you are at risk for osteoporosis and fractures, ask your health care provider if you should:  Be screened for bone loss.  Take a calcium or vitamin D supplement to lower your risk of fractures.  Be given hormone replacement therapy (HRT) to treat symptoms of menopause. Follow these instructions at home: Lifestyle  Do not use any products that contain nicotine or tobacco, such as cigarettes, e-cigarettes, and chewing tobacco. If you need help quitting, ask your health care provider.  Do not use street drugs.  Do not share needles.  Ask your health care provider for help if you need support or information about quitting drugs. Alcohol use  Do not drink alcohol if: ? Your health care provider tells you not to drink. ? You are pregnant, may be pregnant, or are planning to become pregnant.  If you drink alcohol: ? Limit how much you use to 0-1 drink a day. ? Limit intake if you are breastfeeding.  Be aware of how much alcohol is in your drink. In the [place] one drink equals one 12 oz bottle of beer (355 mL), one 5 oz glass of wine (148 mL), or one 1 oz glass of hard liquor (44 mL). General instructions  Schedule regular health, dental, and eye exams.  Stay current with your vaccines.  Tell your health care provider if: ? You often feel depressed. ? You have ever been abused or do not feel safe at home. Summary  Adopting a healthy lifestyle and getting preventive care are important in promoting health and wellness.  Follow your health care provider's instructions about healthy  diet, exercising, and getting tested or screened for diseases.  Follow your health care provider's instructions on monitoring your cholesterol and blood pressure. This information is not intended to replace advice given to you by your health care provider. Make sure you discuss any questions you have with your health care provider. Document Released: 08/07/2010 Document Revised: 01/15/2018 Document Reviewed: 01/15/2018 Elsevier Patient Education  2020 Elsevier Inc.   Breast Self-Awareness Breast self-awareness is knowing how your breasts look and feel. Doing breast self-awareness is important. It allows you to catch a breast problem early while it is still small and can be treated. All women should do breast self-awareness, including women who have had breast implants. Tell your doctor if you notice a change in your breasts. What you need:  A mirror.  A well-lit room. How to do a breast self-exam A breast self-exam is one way to learn what is normal for your breasts and to check for changes. To do a breast self-exam: Look for changes  1.   Take off all the clothes above your waist. 2. Stand in front of a mirror in a room with good lighting. 3. Put your hands on your hips. 4. Push your hands down. 5. Look at your breasts and nipples in the mirror to see if one breast or nipple looks different from the other. Check to see if: ? The shape of one breast is different. ? The size of one breast is different. ? There are wrinkles, dips, and bumps in one breast and not the other. 6. Look at each breast for changes in the skin, such as: ? Redness. ? Scaly areas. 7. Look for changes in your nipples, such as: ? Liquid around the nipples. ? Bleeding. ? Dimpling. ? Redness. ? A change in where the nipples are. Feel for changes  1. Lie on your back on the floor. 2. Feel each breast. To do this, follow these steps: ? Pick a breast to feel. ? Put the arm closest to that breast above your head.  ? Use your other arm to feel the nipple area of your breast. Feel the area with the pads of your three middle fingers by making small circles with your fingers. For the first circle, press lightly. For the second circle, press harder. For the third circle, press even harder. ? Keep making circles with your fingers at the different pressures as you move down your breast. Stop when you feel your ribs. ? Move your fingers a little toward the center of your body. ? Start making circles with your fingers again, this time going up until you reach your collarbone. ? Keep making up-and-down circles until you reach your armpit. Remember to keep using the three pressures. ? Feel the other breast in the same way. 3. Sit or stand in the tub or shower. 4. With soapy water on your skin, feel each breast the same way you did in step 2 when you were lying on the floor. Write down what you find Writing down what you find can help you remember what to tell your doctor. Write down:  What is normal for each breast.  Any changes you find in each breast, including: ? The kind of changes you find. ? Whether you have pain. ? Size and location of any lumps.  When you last had your menstrual period. General tips  Check your breasts every month.  If you are breastfeeding, the best time to check your breasts is after you feed your baby or after you use a breast pump.  If you get menstrual periods, the best time to check your breasts is 5-7 days after your menstrual period is over.  With time, you will become comfortable with the self-exam, and you will begin to know if there are changes in your breasts. Contact a doctor if you:  See a change in the shape or size of your breasts or nipples.  See a change in the skin of your breast or nipples, such as red or scaly skin.  Have fluid coming from your nipples that is not normal.  Find a lump or thick area that was not there before.  Have pain in your breasts.   Have any concerns about your breast health. Summary  Breast self-awareness includes looking for changes in your breasts, as well as feeling for changes within your breasts.  Breast self-awareness should be done in front of a mirror in a well-lit room.  You should check your breasts every month. If you get menstrual   periods, the best time to check your breasts is 5-7 days after your menstrual period is over.  Let your doctor know of any changes you see in your breasts, including changes in size, changes on the skin, pain or tenderness, or fluid from your nipples that is not normal. This information is not intended to replace advice given to you by your health care provider. Make sure you discuss any questions you have with your health care provider. Document Released: 07/11/2007 Document Revised: 09/10/2017 Document Reviewed: 09/10/2017 Elsevier Patient Education  2020 Elsevier Inc.  

## 2018-12-19 NOTE — Progress Notes (Signed)
GYNECOLOGY ANNUAL PHYSICAL EXAM PROGRESS NOTE  Subjective:    Darlene Miller is a 28 y.o. G51P1001 female who presents for an annual exam. The patient has no complaints today. The patient is sexually active.  The patient wears seatbelts: yes. The patient participates in regular exercise: no. Has the patient ever been transfused or tattooed?: no. The patient reports that there is not domestic violence in her life.    Gynecologic History Menarche age: 16 or 27 No LMP recorded. Patient has had an injection.  Occasional cycles on the Depo provera.  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 Results were: normal.  Reports h/o abnormal pap smear in 2017 (LGSIL, followed by colposcopy, normal biopsy).     OB History  Gravida Para Term Preterm AB Living  1 1 1  0 0 1  SAB TAB Ectopic Multiple Live Births  0 0 0 0 1    # Outcome Date GA Lbr Len/2nd Weight Sex Delivery Anes PTL Lv  1 Term 2018    F CS-LTranv  N LIV     Complications: Breech birth    Obstetric Comments  G1- IUGR in pregnancy    Past Medical History:  Diagnosis Date  . Anxiety   . 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 CESAREAN SECTION N/A 06/08/2016   Procedure: CESAREAN SECTION;  Surgeon: 08/08/2016, MD;  Location: [facility] ORS;  Service: Obstetrics;  Laterality: N/A;  Female born @ [birth year] Weight: 5lbs  Apgars: 8/9  . NECK SURGERY     mass on neck- benign    Family History  Problem Relation Age of Onset  . Hypertension Mother   . Restless legs syndrome Mother   . COPD Mother   . Cancer Neg Hx   . Diabetes Neg Hx   . Heart disease Neg Hx     Social History   Socioeconomic History  . Marital status: Single    Spouse name: Not on file  . Number of children: Not on file  . Years of education: Not on file  . Highest education level: Not on file   Occupational History  . Not on file  Social Needs  . Financial resource strain: Not on file  . Food insecurity    Worry: Not on file    Inability: Not on file  . Transportation needs    Medical: Not on file    Non-medical: Not on file  Tobacco Use  . Smoking status: Light Tobacco Smoker    Packs/day: 0.25    Types: Cigarettes    Last attempt to quit: 08/06/2015    Years since quitting: 3.3  . Smokeless tobacco: Never Used  Substance and Sexual Activity  . Alcohol use: Yes    Comment: occas  . Drug use: No  . Sexual activity: Yes    Birth control/protection: Injection  Lifestyle  . Physical activity    Days per week: Not on file    Minutes per session: Not on file  . Stress: Not on file  Relationships  . Social 10/07/2015 on phone: Not on file    Gets together: Not on file    Attends religious service: Not on file    Active member of club or organization: Not on file    Attends meetings of clubs or organizations: Not on  file    Relationship status: Not on file  . Intimate partner violence    Fear of current or ex partner: Not on file    Emotionally abused: Not on file    Physically abused: Not on file    Forced sexual activity: Not on file  Other Topics Concern  . Not on file  Social History Narrative  . Not on file    Current Outpatient Medications on File Prior to Visit  Medication Sig Dispense Refill  . medroxyPROGESTERone (DEPO-PROVERA) 150 MG/ML injection INJECT 1ML INTO THE MUSCLE EVERY THREE MONTHS 1 mL 0  . valACYclovir (VALTREX) 500 MG tablet Take 1 tablet (500 mg total) by mouth 2 (two) times daily. 30 tablet 6   No current facility-administered medications on file prior to visit.     No Known Allergies   Review of Systems Constitutional: negative for chills, fatigue, fevers and sweats Eyes: negative for irritation, redness and visual disturbance Ears, nose, mouth, throat, and face: negative for hearing loss, nasal congestion, snoring and  tinnitus Respiratory: negative for asthma, cough, sputum Cardiovascular: negative for chest pain, dyspnea, exertional chest pressure/discomfort, irregular heart beat, palpitations and syncope Gastrointestinal: negative for abdominal pain, change in bowel habits, nausea and vomiting Genitourinary: negative for abnormal menstrual periods, genital lesions, sexual problems and vaginal discharge, dysuria and urinary incontinence Integument/breast: negative for breast lump, breast tenderness and nipple discharge Hematologic/lymphatic: negative for bleeding and easy bruising Musculoskeletal:negative for back pain and muscle weakness Neurological: negative for dizziness, headaches, vertigo and weakness Endocrine: negative for diabetic symptoms including polydipsia, polyuria and skin dryness Allergic/Immunologic: negative for hay fever and urticaria        Objective:  Blood pressure 101/67, pulse 82, height 5' (1.524 m), weight 120 lb 3.2 oz (54.5 kg). Body mass index is 23.47 kg/m.  General Appearance:    Alert, cooperative, no distress, appears stated age  Head:    Normocephalic, without obvious abnormality, atraumatic  Eyes:    PERRL, conjunctiva/corneas clear, EOM's intact, both eyes  Ears:    Normal external ear canals, both ears  Nose:   Nares normal, septum midline, mucosa normal, no drainage or sinus tenderness  Throat:   Lips, mucosa, and tongue normal; teeth and gums normal  Neck:   Supple, symmetrical, trachea midline, no adenopathy; thyroid: no enlargement/tenderness/nodules; no carotid bruit or JVD  Back:     Symmetric, no curvature, ROM normal, no CVA tenderness  Lungs:     Clear to auscultation bilaterally, respirations unlabored  Chest Wall:    No tenderness or deformity   Heart:    Regular rate and rhythm, S1 and S2 normal, no murmur, rub or gallop  Breast Exam:    No tenderness, masses, or nipple abnormality  Abdomen:     Soft, non-tender, bowel sounds active all four  quadrants, no masses, no organomegaly.    Genitalia:    Pelvic:external genitalia normal, vagina without lesions, discharge, or tenderness, rectovaginal septum  normal. Cervix normal in appearance, no cervical motion tenderness, no adnexal masses or tenderness.  Uterus normal size, shape, mobile, regular contours, nontender.  Rectal:    Normal external sphincter.  No hemorrhoids appreciated. Internal exam not done.   Extremities:   Extremities normal, atraumatic, no cyanosis or edema  Pulses:   2+ and symmetric all extremities  Skin:   Skin color, texture, turgor normal, no rashes or lesions  Lymph nodes:   Cervical, supraclavicular, and axillary nodes normal  Neurologic:   CNII-XII intact, normal strength,  sensation and reflexes throughout   .  Labs:  Lab Results  Component Value Date   WBC 14.7 (H) 06/09/2016   HGB 10.5 (L) 06/09/2016   HCT 30.5 (L) 06/09/2016   MCV 90.4 06/09/2016   PLT 206 06/09/2016    Lab Results  Component Value Date   CREATININE 0.76 11/07/2012   BUN 9 11/07/2012   NA 139 11/07/2012   K 4.0 11/07/2012   CL 105 11/07/2012   CO2 27 11/07/2012    Lab Results  Component Value Date   ALT 27 11/07/2012   AST 24 11/07/2012   ALKPHOS 75 11/07/2012   BILITOT 0.5 11/07/2012    No results found for: TSH   Assessment:   Encounter for well woman exam with routine gynecological exam  Cervical cancer screening  Screen for STD (sexually transmitted disease)  Depo-Provera contraceptive status  Plan:     Blood tests: CBC with diff, Comprehensive metabolic panel, Lipoproteins and TSH. Breast self exam technique reviewed and patient encouraged to perform self-exam monthly. Contraception: Depo-Provera injections. Next injection due in 2 months.  Discussed healthy lifestyle modifications. Pap smear performed today.  Flu vaccine received in September at job.  RTC in 1 year for annual exam.   Hildred Laserherry, Clelia Trabucco, MD [facility]

## 2018-12-20 LAB — COMPREHENSIVE METABOLIC PANEL
ALT: 19 IU/L (ref 0–32)
AST: 21 IU/L (ref 0–40)
Albumin/Globulin Ratio: 2.2 (ref 1.2–2.2)
Albumin: 4.7 g/dL (ref 3.9–5.0)
Alkaline Phosphatase: 83 IU/L (ref 39–117)
BUN/Creatinine Ratio: 23 (ref 9–23)
BUN: 18 mg/dL (ref 6–20)
Bilirubin Total: <0.2 mg/dL (ref 0.0–1.2)
CO2: 22 mmol/L (ref 20–29)
Calcium: 9.5 mg/dL (ref 8.7–10.2)
Chloride: 102 mmol/L (ref 96–106)
Creatinine, Ser: 0.77 mg/dL (ref 0.57–1.00)
GFR calc Af Amer: 120 mL/min/{1.73_m2} (ref >59)
GFR calc non Af Amer: 104 mL/min/{1.73_m2} (ref >59)
Globulin, Total: 2.1 g/dL (ref 1.5–4.5)
Glucose: 85 mg/dL (ref 65–99)
Potassium: 4.2 mmol/L (ref 3.5–5.2)
Sodium: 140 mmol/L (ref 134–144)
Total Protein: 6.8 g/dL (ref 6.0–8.5)

## 2018-12-20 LAB — CBC
Hematocrit: 42 % (ref 34.0–46.6)
Hemoglobin: 14.4 g/dL (ref 11.1–15.9)
MCH: 31 pg (ref 26.6–33.0)
MCHC: 34.3 g/dL (ref 31.5–35.7)
MCV: 90 fL (ref 79–97)
Platelets: 258 10*3/uL (ref 150–450)
RBC: 4.65 x10E6/uL (ref 3.77–5.28)
RDW: 12.2 % (ref 11.7–15.4)
WBC: 7.3 10*3/uL (ref 3.4–10.8)

## 2018-12-20 LAB — TSH: TSH: 1.4 u[IU]/mL (ref 0.450–4.500)

## 2018-12-20 LAB — LIPID PANEL
Chol/HDL Ratio: 2.7 ratio (ref 0.0–4.4)
Cholesterol, Total: 210 mg/dL — ABNORMAL HIGH (ref 100–199)
HDL: 77 mg/dL (ref >39)
LDL Chol Calc (NIH): 116 mg/dL — ABNORMAL HIGH (ref 0–99)
Triglycerides: 98 mg/dL (ref 0–149)
VLDL Cholesterol Cal: 17 mg/dL (ref 5–40)

## 2018-12-20 LAB — RPR: RPR Ser Ql: NONREACTIVE

## 2018-12-20 LAB — HIV ANTIBODY (ROUTINE TESTING W REFLEX): HIV Screen 4th Generation wRfx: NONREACTIVE

## 2018-12-25 LAB — CYTOLOGY - PAP
Chlamydia: NEGATIVE
Comment: NEGATIVE
Comment: NEGATIVE
Comment: NEGATIVE
Comment: NEGATIVE
Comment: NORMAL
Diagnosis: NEGATIVE
HPV 16: NEGATIVE
HPV 18 / 45: NEGATIVE
High risk HPV: POSITIVE — AB
Neisseria Gonorrhea: NEGATIVE

## 2019-01-22 ENCOUNTER — Telehealth: Payer: Self-pay

## 2019-01-22 ENCOUNTER — Other Ambulatory Visit: Payer: Self-pay | Admitting: Obstetrics and Gynecology

## 2019-01-22 DIAGNOSIS — Z3009 Encounter for other general counseling and advice on contraception: Secondary | ICD-10-CM

## 2019-01-22 NOTE — Telephone Encounter (Signed)
Patient has depo appt on 01/23/19. Called [facility] and she has no refills on the prescription. Please refill depo.

## 2019-01-23 ENCOUNTER — Other Ambulatory Visit: Payer: Self-pay

## 2019-01-23 ENCOUNTER — Ambulatory Visit (INDEPENDENT_AMBULATORY_CARE_PROVIDER_SITE_OTHER): Payer: BC Managed Care – PPO | Admitting: Obstetrics and Gynecology

## 2019-01-23 VITALS — BP 105/64 | HR 91 | Ht 60.0 in | Wt 122.9 lb

## 2019-01-23 DIAGNOSIS — Z3042 Encounter for surveillance of injectable contraceptive: Secondary | ICD-10-CM

## 2019-01-23 MED ORDER — MEDROXYPROGESTERONE ACETATE 150 MG/ML IM SUSP
150.0000 mg | Freq: Once | INTRAMUSCULAR | Status: AC
  Administered 2019-01-23: 150 mg via INTRAMUSCULAR

## 2019-01-23 NOTE — Progress Notes (Signed)
Date last pap: 12/19/18. Last Depo-Provera: 11/03/18. Side Effects if any: none. Serum HCG indicated? none. Depo-Provera 150 mg IM given by: FHampton,LPN. Next appointment due March 5-19, 2021.Marland Kitchen   BP 105/64   Pulse 91   Ht 5' (1.524 m)   Wt 122 lb 14.4 oz (55.7 kg)   BMI 24.00 kg/m

## 2019-02-06 HISTORY — PX: OTHER SURGICAL HISTORY: SHX169

## 2019-02-16 DIAGNOSIS — Z3042 Encounter for surveillance of injectable contraceptive: Secondary | ICD-10-CM | POA: Insufficient documentation

## 2019-03-27 ENCOUNTER — Telehealth: Payer: Self-pay | Admitting: Obstetrics and Gynecology

## 2019-03-27 NOTE — Telephone Encounter (Signed)
Patient called saying she was traveling to [facility] April 15th for a plastic surgery procedure. The doctor doing the procedure is wanting her to have a list of test performed before the surgery. Could you please advise this patient.   Thank you, TC

## 2019-04-15 NOTE — Telephone Encounter (Signed)
Pt is aware that [facility] requested a list of the labs that she needs to have done for the surgery. Pt stated that she would sent the information via mychart or bring it with her during her lab appointment.

## 2019-07-13 ENCOUNTER — Ambulatory Visit (INDEPENDENT_AMBULATORY_CARE_PROVIDER_SITE_OTHER): Payer: BC Managed Care – PPO | Admitting: Surgical

## 2019-07-13 ENCOUNTER — Other Ambulatory Visit: Payer: Self-pay

## 2019-07-13 DIAGNOSIS — Z3042 Encounter for surveillance of injectable contraceptive: Secondary | ICD-10-CM

## 2019-07-13 LAB — POCT URINE PREGNANCY: Preg Test, Ur: NEGATIVE

## 2019-07-13 MED ORDER — MEDROXYPROGESTERONE ACETATE 150 MG/ML IM SUSP
150.0000 mg | Freq: Once | INTRAMUSCULAR | Status: AC
  Administered 2019-07-13: 150 mg via INTRAMUSCULAR

## 2019-07-13 NOTE — Progress Notes (Signed)
Date last pap: NA Last Depo-Provera: 01/23/2020 Side Effects if any: NA Serum HCG indicated? today Depo-Provera 150 mg IM given by: [facility] CMA  Next appointment due 09/28/19-10/12/19

## 2019-10-07 ENCOUNTER — Ambulatory Visit
Admission: EM | Admit: 2019-10-07 | Discharge: 2019-10-07 | Disposition: A | Discharge status: Home / Self Care | Payer: BC Managed Care – PPO | Attending: Emergency Medicine | Admitting: Emergency Medicine

## 2019-10-07 DIAGNOSIS — L282 Other prurigo: Secondary | ICD-10-CM

## 2019-10-07 MED ORDER — METHYLPREDNISOLONE SODIUM SUCC 125 MG IJ SOLR
125.0000 mg | Freq: Once | INTRAMUSCULAR | Status: AC
  Administered 2019-10-07: 125 mg via INTRAMUSCULAR

## 2019-10-07 MED ORDER — HYDROXYZINE HCL 25 MG PO TABS: 25.0000 mg | ORAL_TABLET | Freq: Four times a day (QID) | ORAL | 0 refills | Status: DC

## 2019-10-07 NOTE — ED Triage Notes (Signed)
Pt c/o rash to bilateral upper thighs, bilateral breast, and upper arms since Monday. States has used benadryl cream and ice with relief for itching.

## 2019-10-07 NOTE — ED Provider Notes (Signed)
[facility]    CSN: [id number] Arrival date & time: 10/07/19  1201      History   Chief Complaint Chief Complaint  Patient presents with  . Rash    HPI Darlene Miller is a 29 y.o. female  Presenting for erythematous, pruritic rash to bilateral upper thighs, chest, upper arms since Monday.  Has used Benadryl cream and ice without significant relief.  Denies swelling of lips, tongue, throat.  No known irritant exposures, [place]  No fever, arthralgias, myalgias.  Past Medical History:  Diagnosis Date  . Anxiety   . 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 Encounter for management and injection of depo-Provera 02/16/2019  . Tobacco use disorder, moderate, in early remission 02/07/2016  . 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 Anxiety disorder 02/07/2016  . Marijuana use, episodic 01/08/2016  . Papanicolaou smear of cervix with low grade squamous intraepithelial lesion (LGSIL) 02/23/2015    Past Surgical History:  Procedure Laterality Date  . CESAREAN SECTION N/A 06/08/2016   Procedure: CESAREAN SECTION;  Surgeon: Linzie Collin, MD;  Location: [facility] ORS;  Service: Obstetrics;  Laterality: N/A;  Female born @ [birth year] Weight: 5lbs  Apgars: 8/9  . NECK SURGERY     mass on neck- benign    OB History    Gravida  1   Para  1   Term  1   Preterm  0   AB  0   Living  1     SAB  0   TAB  0   Ectopic  0   Multiple  0   Live Births  1        Obstetric Comments  G1- IUGR in pregnancy         Home Medications    Prior to Admission medications   Medication Sig Start Date End Date Taking? Authorizing Provider  hydrOXYzine (ATARAX/VISTARIL) 25 MG tablet Take 1 tablet (25 mg total) by mouth every 6 (six) hours. 10/07/19   Hall-Potvin, [place], PA-C  medroxyPROGESTERone Acetate 150 MG/ML SUSY INJECT 1 ML INTO  THE MUSCLE ONCE EVERY 3 MONTHS 01/22/19   Hildred Laser, MD  valACYclovir (VALTREX) 500 MG tablet Take 1 tablet (500 mg total) by mouth 2 (two) times daily. 05/31/16   Linzie Collin, MD    Family History Family History  Problem Relation Age of Onset  . Hypertension Mother   . Restless legs syndrome Mother   . COPD Mother   . Cancer Neg Hx   . Diabetes Neg Hx   . Heart disease Neg Hx     Social History Social History   Tobacco Use  . Smoking status: Light Tobacco Smoker    Packs/day: 0.25    Types: Cigarettes    Last attempt to quit: 08/06/2015    Years since quitting: 4.1  . Smokeless tobacco: Never Used  Vaping Use  . Vaping Use: Never used  Substance Use Topics  . Alcohol use: Yes    Comment: occas  . Drug use: No     Allergies   Patient has no known allergies.   Review of Systems As per HPI   Physical Exam Triage Vital Signs ED Triage Vitals  Enc Vitals Group     BP 10/07/19 1440 119/79     Pulse Rate 10/07/19  1440 70     Resp 10/07/19 1440 18     Temp 10/07/19 1440 98.7 F (37.1 C)     Temp Source 10/07/19 1440 Oral     SpO2 10/07/19 1440 98 %     Weight --      Height --      Head Circumference --      Peak Flow --      Pain Score 10/07/19 1446 3     Pain Loc --      Pain Edu? --      Excl. in GC? --    No data found.  Updated Vital Signs BP 119/79 (BP Location: Left Arm)   Pulse 70   Temp 98.7 F (37.1 C) (Oral)   Resp 18   SpO2 98%   Visual Acuity Right Eye Distance:   Left Eye Distance:   Bilateral Distance:    Right Eye Near:   Left Eye Near:    Bilateral Near:     Physical Exam Constitutional:      General: She is not in acute distress. HENT:     Head: Normocephalic and atraumatic.  Eyes:     General: No scleral icterus.    Pupils: Pupils are equal, round, and reactive to light.  Cardiovascular:     Rate and Rhythm: Normal rate.  Pulmonary:     Effort: Pulmonary effort is normal.  Skin:    Capillary Refill:  Capillary refill takes less than 2 seconds.     Coloration: Skin is not jaundiced or pale.     Findings: Rash present.     Comments: Flat, erythematous rash to bilateral medial thighs, proximal upper extremities, chest.  No tenderness, warmth, adventitious lesions or discharge.  Neurological:     General: No focal deficit present.     Mental Status: She is alert and oriented to person, place, and time.      UC Treatments / Results  Labs (all labs ordered are listed, but only abnormal results are displayed) Labs Reviewed - No data to display  EKG   Radiology No results found.  Procedures Procedures (including critical care time)  Medications Ordered in UC Medications  methylPREDNISolone sodium succinate (SOLU-MEDROL) 125 mg/2 mL injection 125 mg (125 mg Intramuscular Given 10/07/19 1522)    Initial Impression / Assessment and Plan / UC Course  I have reviewed the triage vital signs and the nursing notes.  Pertinent labs & imaging results that were available during my care of the patient were reviewed by me and considered in my medical decision making (see chart for details).     Patient febrile, nontoxic, without respiratory distress.  Likely prolonged sun exposure VS irritant exposure and like.  Given Solu-Medrol in office which he tolerated well.  Return precautions discussed, pt verbalized understanding and is agreeable to plan. Final Clinical Impressions(s) / UC Diagnoses   Final diagnoses:  Pruritic rash     Discharge Instructions     Keep skin clean - may use gentle soaps without perfumes/dyes. Avoid hot water (showers, baths) as this can further dry out and irritate skin. Pat skin dry as rubbing can irritate and tear skin. Apply a gentle moisturizer 1-2 times daily.    ED Prescriptions    Medication Sig Dispense Auth. Provider   hydrOXYzine (ATARAX/VISTARIL) 25 MG tablet Take 1 tablet (25 mg total) by mouth every 6 (six) hours. 12 tablet Hall-Potvin,  [place], PA-C     PDMP not reviewed this encounter.  Hall-Potvin, [place], [place] 10/07/19 1532

## 2019-10-07 NOTE — Discharge Instructions (Addendum)
Keep skin clean - may use gentle soaps without perfumes/dyes. Avoid hot water (showers, baths) as this can further dry out and irritate skin. Pat skin dry as rubbing can irritate and tear skin. Apply a gentle moisturizer 1-2 times daily. 

## 2019-10-15 NOTE — Progress Notes (Deleted)
Last depo inj: 07/13/19 UPT: today Side effects: N/A Next Depo- Provera injection due: 11/30/19-12/14/19 Annual exam due: N/A

## 2019-10-16 ENCOUNTER — Ambulatory Visit: Payer: BC Managed Care – PPO

## 2019-10-20 ENCOUNTER — Other Ambulatory Visit: Payer: Self-pay

## 2019-10-20 ENCOUNTER — Telehealth: Payer: Self-pay

## 2019-10-20 ENCOUNTER — Ambulatory Visit (INDEPENDENT_AMBULATORY_CARE_PROVIDER_SITE_OTHER): Payer: BC Managed Care – PPO

## 2019-10-20 DIAGNOSIS — Z3042 Encounter for surveillance of injectable contraceptive: Secondary | ICD-10-CM | POA: Diagnosis not present

## 2019-10-20 MED ORDER — MEDROXYPROGESTERONE ACETATE 150 MG/ML IM SUSP
150.0000 mg | Freq: Once | INTRAMUSCULAR | Status: AC
  Administered 2019-10-20: 150 mg via INTRAMUSCULAR

## 2019-10-20 NOTE — Progress Notes (Addendum)
Last depo inj: 07/13/19 UPT: NEGATIVE Side effects: none Next Depo- Provera injection due: 01/05/20-01/19/20 Annual exam due: 01/05/2020

## 2019-10-20 NOTE — Telephone Encounter (Signed)
Patient states she can not come in any earlier than 4PM.

## 2019-10-20 NOTE — Telephone Encounter (Signed)
mychart message sent

## 2019-12-23 ENCOUNTER — Other Ambulatory Visit (HOSPITAL_COMMUNITY)
Admission: RE | Admit: 2019-12-23 | Discharge: 2019-12-23 | Disposition: A | Discharge status: Home / Self Care | Payer: BC Managed Care – PPO | Source: Ambulatory Visit | Attending: Obstetrics and Gynecology | Admitting: Obstetrics and Gynecology

## 2019-12-23 ENCOUNTER — Ambulatory Visit (INDEPENDENT_AMBULATORY_CARE_PROVIDER_SITE_OTHER): Payer: BC Managed Care – PPO | Admitting: Obstetrics and Gynecology

## 2019-12-23 ENCOUNTER — Encounter: Payer: Self-pay | Admitting: Obstetrics and Gynecology

## 2019-12-23 ENCOUNTER — Other Ambulatory Visit: Payer: Self-pay

## 2019-12-23 VITALS — BP 106/72 | HR 67 | Ht 60.0 in | Wt 124.6 lb

## 2019-12-23 DIAGNOSIS — Z23 Encounter for immunization: Secondary | ICD-10-CM | POA: Diagnosis not present

## 2019-12-23 DIAGNOSIS — Z789 Other specified health status: Secondary | ICD-10-CM

## 2019-12-23 DIAGNOSIS — Z124 Encounter for screening for malignant neoplasm of cervix: Secondary | ICD-10-CM | POA: Insufficient documentation

## 2019-12-23 DIAGNOSIS — Z01419 Encounter for gynecological examination (general) (routine) without abnormal findings: Secondary | ICD-10-CM

## 2019-12-23 NOTE — Patient Instructions (Signed)
Preventive Care 21-31 Years Old, Female Preventive care refers to visits with your health care provider and lifestyle choices that can promote health and wellness. This includes:  A yearly physical exam. This may also be called an annual well check.  Regular dental visits and eye exams.  Immunizations.  Screening for certain conditions.  Healthy lifestyle choices, such as eating a healthy diet, getting regular exercise, not using drugs or products that contain nicotine and tobacco, and limiting alcohol use. What can I expect for my preventive care visit? Physical exam Your health care provider will check your:  Height and weight. This may be used to calculate body mass index (BMI), which tells if you are at a healthy weight.  Heart rate and blood pressure.  Skin for abnormal spots. Counseling Your health care provider may ask you questions about your:  Alcohol, tobacco, and drug use.  Emotional well-being.  Home and relationship well-being.  Sexual activity.  Eating habits.  Work and work environment.  Method of birth control.  Menstrual cycle.  Pregnancy history. What immunizations do I need?  Influenza (flu) vaccine  This is recommended every year. Tetanus, diphtheria, and pertussis (Tdap) vaccine  You may need a Td booster every 10 years. Varicella (chickenpox) vaccine  You may need this if you have not been vaccinated. Human papillomavirus (HPV) vaccine  If recommended by your health care provider, you may need three doses over 6 months. Measles, mumps, and rubella (MMR) vaccine  You may need at least one dose of MMR. You may also need a second dose. Meningococcal conjugate (MenACWY) vaccine  One dose is recommended if you are age 19-21 years and a first-year college student living in a residence hall, or if you have one of several medical conditions. You may also need additional booster doses. Pneumococcal conjugate (PCV13) vaccine  You may need  this if you have certain conditions and were not previously vaccinated. Pneumococcal polysaccharide (PPSV23) vaccine  You may need one or two doses if you smoke cigarettes or if you have certain conditions. Hepatitis A vaccine  You may need this if you have certain conditions or if you travel or work in places where you may be exposed to hepatitis A. Hepatitis B vaccine  You may need this if you have certain conditions or if you travel or work in places where you may be exposed to hepatitis B. Haemophilus influenzae type b (Hib) vaccine  You may need this if you have certain conditions. You may receive vaccines as individual doses or as more than one vaccine together in one shot (combination vaccines). Talk with your health care provider about the risks and benefits of combination vaccines. What tests do I need?  Blood tests  Lipid and cholesterol levels. These may be checked every 5 years starting at age 20.  Hepatitis C test.  Hepatitis B test. Screening  Diabetes screening. This is done by checking your blood sugar (glucose) after you have not eaten for a while (fasting).  Sexually transmitted disease (STD) testing.  BRCA-related cancer screening. This may be done if you have a family history of breast, ovarian, tubal, or peritoneal cancers.  Pelvic exam and Pap test. This may be done every 3 years starting at age 21. Starting at age 30, this may be done every 5 years if you have a Pap test in combination with an HPV test. Talk with your health care provider about your test results, treatment options, and if necessary, the need for more tests.   Follow these instructions at home: Eating and drinking   Eat a diet that includes fresh fruits and vegetables, whole grains, lean protein, and low-fat dairy.  Take vitamin and mineral supplements as recommended by your health care provider.  Do not drink alcohol if: ? Your health care provider tells you not to drink. ? You are  pregnant, may be pregnant, or are planning to become pregnant.  If you drink alcohol: ? Limit how much you have to 0-1 drink a day. ? Be aware of how much alcohol is in your drink. In the [place] one drink equals one 12 oz bottle of beer (355 mL), one 5 oz glass of wine (148 mL), or one 1 oz glass of hard liquor (44 mL). Lifestyle  Take daily care of your teeth and gums.  Stay active. Exercise for at least 30 minutes on 5 or more days each week.  Do not use any products that contain nicotine or tobacco, such as cigarettes, e-cigarettes, and chewing tobacco. If you need help quitting, ask your health care provider.  If you are sexually active, practice safe sex. Use a condom or other form of birth control (contraception) in order to prevent pregnancy and STIs (sexually transmitted infections). If you plan to become pregnant, see your health care provider for a preconception visit. What's next?  Visit your health care provider once a year for a well check visit.  Ask your health care provider how often you should have your eyes and teeth checked.  Stay up to date on all vaccines. This information is not intended to replace advice given to you by your health care provider. Make sure you discuss any questions you have with your health care provider. Document Revised: 10/03/2017 Document Reviewed: 10/03/2017 Elsevier Patient Education  2020 [place] Breast self-awareness means being familiar with how your breasts look and feel. It involves checking your breasts regularly and reporting any changes to your health care provider. Practicing breast self-awareness is important. Sometimes changes may not be harmful (are benign), but sometimes a change in your breasts can be a sign of a serious medical problem. It is important to learn how to do this procedure correctly so that you can catch problems early, when treatment is more likely to be successful. All women should  practice breast self-awareness, including women who have had breast implants. What you need: A mirror. A well-lit room. How to do a breast self-exam A breast self-exam is one way to learn what is normal for your breasts and whether your breasts are changing. To do a breast self-exam: Look for changes  Remove all the clothing above your waist. Stand in front of a mirror in a room with good lighting. Put your hands on your hips. Push your hands firmly downward. Compare your breasts in the mirror. Look for differences between them (asymmetry), such as: Differences in shape. Differences in size. Puckers, dips, and bumps in one breast and not the other. Look at each breast for changes in the skin, such as: Redness. Scaly areas. Look for changes in your nipples, such as: Discharge. Bleeding. Dimpling. Redness. A change in position. Feel for changes Carefully feel your breasts for lumps and changes. It is best to do this while lying on your back on the floor, and again while sitting or standing in the tub or shower with soapy water on your skin. Feel each breast in the following way: Place the arm on the side of the  breast you are examining above your head. Feel your breast with the other hand. Start in the nipple area and make -inch (2 cm) overlapping circles to feel your breast. Use the pads of your three middle fingers to do this. Apply light pressure, then medium pressure, then firm pressure. The light pressure will allow you to feel the tissue closest to the skin. The medium pressure will allow you to feel the tissue that is a little deeper. The firm pressure will allow you to feel the tissue close to the ribs. Continue the overlapping circles, moving downward over the breast until you feel your ribs below your breast. Move one finger-width toward the center of the body. Continue to use the -inch (2 cm) overlapping circles to feel your breast as you move slowly up toward your  collarbone. Continue the up-and-down exam using all three pressures until you reach your armpit.  Write down what you find Writing down what you find can help you remember what to discuss with your health care provider. Write down: What is normal for each breast. Any changes that you find in each breast, including: The kind of changes you find. Any pain or tenderness. Size and location of any lumps. Where you are in your menstrual cycle, if you are still menstruating. General tips and recommendations Examine your breasts every month. If you are breastfeeding, the best time to examine your breasts is after a feeding or after using a breast pump. If you menstruate, the best time to examine your breasts is 5-7 days after your period. Breasts are generally lumpier during menstrual periods, and it may be more difficult to notice changes. With time and practice, you will become more familiar with the variations in your breasts and more comfortable with the exam. Contact a health care provider if you: See a change in the shape or size of your breasts or nipples. See a change in the skin of your breast or nipples, such as a reddened or scaly area. Have unusual discharge from your nipples. Find a lump or thick area that was not there before. Have pain in your breasts. Have any concerns related to your breast health. Summary Breast self-awareness includes looking for physical changes in your breasts, as well as feeling for any changes within your breasts. Breast self-awareness should be performed in front of a mirror in a well-lit room. You should examine your breasts every month. If you menstruate, the best time to examine your breasts is 5-7 days after your menstrual period. Let your health care provider know of any changes you notice in your breasts, including changes in size, changes on the skin, pain or tenderness, or unusual fluid from your nipples. This information is not intended to replace  advice given to you by your health care provider. Make sure you discuss any questions you have with your health care provider. Document Revised: 09/10/2017 Document Reviewed: 09/10/2017 Elsevier Patient Education  [place].

## 2019-12-23 NOTE — Progress Notes (Signed)
Pt present for annual exam. Pt stated that she was doing well. Pt 's last pap was 12/19/2018 results abnormal. Flu vaccine administered today.

## 2019-12-23 NOTE — Progress Notes (Signed)
GYNECOLOGY ANNUAL PHYSICAL EXAM PROGRESS NOTE  Subjective:    Darlene Miller is a 29 y.o. G62P1001 female who presents for an annual exam. The patient has no complaints today. The patient is sexually active.  The patient wears seatbelts: yes. The patient participates in regular exercise: no. Has the patient ever been transfused or tattooed?: no. The patient reports that there is not domestic violence in her life.    1. Had BBL done ~ 6 months ago in [place].  Had bloodwork done at that time.    Gynecologic History Menarche age: 78 or 60 No LMP recorded (lmp unknown). Patient has had an injection.  Occasional cycles on the Depo provera.  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 Results were: NILM, HR HPV+ (neg types 16/18/45).  Reports h/o abnormal pap smear in 2017 (LGSIL, followed by colposcopy, normal biopsy).     Upstream - 12/23/19 1513      Pregnancy Intention Screening   Does the patient want to become pregnant in the next year? No    Does the patient's partner want to become pregnant in the next year? No    Would the patient like to discuss contraceptive options today? No      Contraception Wrap Up   Current Method Hormonal Injection          The pregnancy intention screening data noted above was reviewed. Potential methods of contraception were not discussed. The patient elected to continue with with Hormonal Injection.    OB History  Gravida Para Term Preterm AB Living  1 1 1  0 0 1  SAB TAB Ectopic Multiple Live Births  0 0 0 0 1    # Outcome Date GA Lbr Len/2nd Weight Sex Delivery Anes PTL Lv  1 Term 2018    F CS-LTranv  N LIV     Complications: Breech birth    Obstetric Comments  G1- IUGR in pregnancy    Past Medical History:  Diagnosis Date  . Anxiety   . 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  CESAREAN SECTION N/A 06/08/2016   Procedure: CESAREAN SECTION;  Surgeon: 08/08/2016, MD;  Location: [facility] ORS;  Service: Obstetrics;  Laterality: N/A;  Female born @ 1041 Weight: 5lbs  Apgars: 8/9  . NECK SURGERY     mass on neck- benign    Family History  Problem Relation Age of Onset  . Hypertension Mother   . Restless legs syndrome Mother   . COPD Mother   . Cancer Neg Hx   . Diabetes Neg Hx   . Heart disease Neg Hx     Social History   Socioeconomic History  . Marital status: Single    Spouse name: Not on file  . Number of children: Not on file  . Years of education: Not on file  . Highest education level: Not on file  Occupational History  . Not on file  Tobacco Use  . Smoking status: Light Tobacco Smoker    Packs/day: 0.25    Types: Cigarettes    Last attempt to quit: 08/06/2015    Years since quitting: 4.3  . Smokeless tobacco: Never Used  Vaping Use  . Vaping Use: Never used  Substance and Sexual Activity  . Alcohol use: Yes    Comment: occas  . Drug use: No  .  Sexual activity: Yes    Birth control/protection: Injection  Other Topics Concern  . Not on file  Social History Narrative  . Not on file   Social Determinants of Health   Financial Resource Strain:   . Difficulty of Paying Living Expenses: Not on file  Food Insecurity:   . Worried About Programme researcher, broadcasting/film/video in the Last Year: Not on file  . Ran Out of Food in the Last Year: Not on file  Transportation Needs:   . Lack of Transportation (Medical): Not on file  . Lack of Transportation (Non-Medical): Not on file  Physical Activity:   . Days of Exercise per Week: Not on file  . Minutes of Exercise per Session: Not on file  Stress:   . Feeling of Stress : Not on file  Social Connections:   . Frequency of Communication with Friends and Family: Not on file  . Frequency of Social Gatherings with Friends and Family: Not on file  . Attends Religious Services: Not on file  . Active Member of  Clubs or Organizations: Not on file  . Attends Banker Meetings: Not on file  . Marital Status: Not on file  Intimate Partner Violence:   . Fear of Current or Ex-Partner: Not on file  . Emotionally Abused: Not on file  . Physically Abused: Not on file  . Sexually Abused: Not on file    Current Outpatient Medications on File Prior to Visit  Medication Sig Dispense Refill  . medroxyPROGESTERone Acetate 150 MG/ML SUSY INJECT 1 ML INTO THE MUSCLE ONCE EVERY 3 MONTHS 1 mL 3  . valACYclovir (VALTREX) 500 MG tablet Take 1 tablet (500 mg total) by mouth 2 (two) times daily. 30 tablet 6   No current facility-administered medications on file prior to visit.    No Known Allergies   Review of Systems Constitutional: negative for chills, fatigue, fevers and sweats Eyes: negative for irritation, redness and visual disturbance Ears, nose, mouth, throat, and face: negative for hearing loss, nasal congestion, snoring and tinnitus Respiratory: negative for asthma, cough, sputum Cardiovascular: negative for chest pain, dyspnea, exertional chest pressure/discomfort, irregular heart beat, palpitations and syncope Gastrointestinal: negative for abdominal pain, change in bowel habits, nausea and vomiting Genitourinary: negative for abnormal menstrual periods, genital lesions, sexual problems and vaginal discharge, dysuria and urinary incontinence Integument/breast: negative for breast lump, breast tenderness and nipple discharge Hematologic/lymphatic: negative for bleeding and easy bruising Musculoskeletal:negative for back pain and muscle weakness Neurological: negative for dizziness, headaches, vertigo and weakness Endocrine: negative for diabetic symptoms including polydipsia, polyuria and skin dryness Allergic/Immunologic: negative for hay fever and urticaria        Objective:  Blood pressure 106/72, pulse 67, height 5' (1.524 m), weight 124 lb 9.6 oz (56.5 kg). Body mass index is  24.33 kg/m.  General Appearance:    Alert, cooperative, no distress, appears stated age  Head:    Normocephalic, without obvious abnormality, atraumatic  Eyes:    PERRL, conjunctiva/corneas clear, EOM's intact, both eyes  Ears:    Normal external ear canals, both ears  Nose:   Nares normal, septum midline, mucosa normal, no drainage or sinus tenderness  Throat:   Lips, mucosa, and tongue normal; teeth and gums normal  Neck:   Supple, symmetrical, trachea midline, no adenopathy; thyroid: no enlargement/tenderness/nodules; no carotid bruit or JVD  Back:     Symmetric, no curvature, ROM normal, no CVA tenderness  Lungs:     Clear to auscultation bilaterally,  respirations unlabored  Chest Wall:    No tenderness or deformity   Heart:    Regular rate and rhythm, S1 and S2 normal, no murmur, rub or gallop  Breast Exam:    No tenderness, masses, or nipple abnormality  Abdomen:     Soft, non-tender, bowel sounds active all four quadrants, no masses, no organomegaly.    Genitalia:    Pelvic:external genitalia normal, vagina without lesions, discharge, or tenderness, rectovaginal septum  normal. Cervix normal in appearance, no cervical motion tenderness, no adnexal masses or tenderness.  Uterus normal size, shape, mobile, regular contours, nontender.  Rectal:    Normal external sphincter.  No hemorrhoids appreciated. Internal exam not done.   Extremities:   Extremities normal, atraumatic, no cyanosis or edema  Pulses:   2+ and symmetric all extremities  Skin:   Skin color, texture, turgor normal, no rashes or lesions  Lymph nodes:   Cervical, supraclavicular, and axillary nodes normal  Neurologic:   CNII-XII intact, normal strength, sensation and reflexes throughout   .  Labs:  Lab Results  Component Value Date   WBC 7.3 12/19/2018   HGB 14.4 12/19/2018   HCT 42.0 12/19/2018   MCV 90 12/19/2018   PLT 258 12/19/2018    Lab Results  Component Value Date   CREATININE 0.77 12/19/2018   BUN 18  12/19/2018   NA 140 12/19/2018   K 4.2 12/19/2018   CL 102 12/19/2018   CO2 22 12/19/2018    Lab Results  Component Value Date   ALT 19 12/19/2018   AST 21 12/19/2018   ALKPHOS 83 12/19/2018   BILITOT <0.2 12/19/2018    Lab Results  Component Value Date   TSH 1.400 12/19/2018     Assessment:   Encounter for well woman exam with routine gynecological exam  Depo-Provera contraceptive status Flu vaccine need  Plan:    Blood tests: No labs ordered, patient reports full bloodwork panel 6 months ago in [place], was normal.  Breast self exam technique reviewed and patient encouraged to perform self-exam monthly. Contraception: Depo-Provera injections. Needs Dexa Scan, has been on Depo Provera for ~ 4 years.   Discussed healthy lifestyle modifications. Pap smear up to date. NILM, HR HPV+ (but neg for 16/18/45 types). Continue routine q 3 year screens.  Flu vaccine given today.  Declines COVID vaccination.  RTC in 1 year for annual exam.    Hildred Laser, MD [facility]

## 2019-12-29 ENCOUNTER — Ambulatory Visit: Payer: BC Managed Care – PPO

## 2019-12-29 LAB — CYTOLOGY - PAP
Diagnosis: NEGATIVE
Diagnosis: REACTIVE

## 2020-01-01 ENCOUNTER — Ambulatory Visit: Payer: BC Managed Care – PPO

## 2020-01-05 ENCOUNTER — Ambulatory Visit: Payer: BC Managed Care – PPO

## 2020-01-08 ENCOUNTER — Ambulatory Visit: Payer: BC Managed Care – PPO

## 2020-01-08 NOTE — Progress Notes (Deleted)
Date last pap: ***.  Last Depo-Provera: ***.  Side Effects if any: ***.  Serum HCG indicated? ***.  Depo-Provera 150 mg IM given by: ***.  Next appointment due ***.

## 2020-01-26 ENCOUNTER — Other Ambulatory Visit: Payer: Self-pay | Admitting: Obstetrics and Gynecology

## 2020-01-26 ENCOUNTER — Other Ambulatory Visit: Payer: Self-pay

## 2020-01-26 DIAGNOSIS — Z3009 Encounter for other general counseling and advice on contraception: Secondary | ICD-10-CM

## 2020-01-26 MED ORDER — MEDROXYPROGESTERONE ACETATE 150 MG/ML IM SUSY: PREFILLED_SYRINGE | INTRAMUSCULAR | 3 refills | Status: DC

## 2020-01-27 ENCOUNTER — Ambulatory Visit (INDEPENDENT_AMBULATORY_CARE_PROVIDER_SITE_OTHER): Payer: BC Managed Care – PPO | Admitting: Obstetrics and Gynecology

## 2020-01-27 ENCOUNTER — Other Ambulatory Visit: Payer: Self-pay

## 2020-01-27 VITALS — BP 106/58 | HR 88 | Ht 60.0 in | Wt 124.3 lb

## 2020-01-27 DIAGNOSIS — Z3202 Encounter for pregnancy test, result negative: Secondary | ICD-10-CM

## 2020-01-27 DIAGNOSIS — Z3042 Encounter for surveillance of injectable contraceptive: Secondary | ICD-10-CM | POA: Diagnosis not present

## 2020-01-27 LAB — POCT URINE PREGNANCY: Preg Test, Ur: NEGATIVE

## 2020-01-27 MED ORDER — MEDROXYPROGESTERONE ACETATE 150 MG/ML IM SUSP
150.0000 mg | Freq: Once | INTRAMUSCULAR | Status: AC
  Administered 2020-01-27: 150 mg via INTRAMUSCULAR

## 2020-01-27 NOTE — Progress Notes (Signed)
Date last pap: 12/23/2019 Last Depo-Provera: 10/20/2019 Side Effects if any: n/a Serum HCG indicated? n/a. Depo-Provera 150 mg IM given by: Darrold Junker, CMA. Next appointment due: 04/13/2020-04/27/2020.

## 2020-04-13 ENCOUNTER — Telehealth: Payer: Self-pay | Admitting: Obstetrics and Gynecology

## 2020-04-13 NOTE — Telephone Encounter (Signed)
New message    1. Which medications need to be refilled? (please list name of each medication and dose if known) medroxyPROGESTERone (DEPO-PROVERA) 150 MG/ML injection  2. Which pharmacy/location (including street and city if local pharmacy) is medication to be sent to?    [id number] - [place] ([place]), [place] - [address] [place] ([place]) [place] [address]   3. Appt on 3.10.22

## 2020-04-14 ENCOUNTER — Other Ambulatory Visit: Payer: Self-pay

## 2020-04-14 ENCOUNTER — Ambulatory Visit: Payer: Self-pay

## 2020-04-14 DIAGNOSIS — Z3042 Encounter for surveillance of injectable contraceptive: Secondary | ICD-10-CM

## 2020-04-14 MED ORDER — MEDROXYPROGESTERONE ACETATE 150 MG/ML IM SUSP
150.0000 mg | Freq: Once | INTRAMUSCULAR | Status: AC
  Administered 2020-04-14: 150 mg via INTRAMUSCULAR

## 2020-04-14 NOTE — Telephone Encounter (Signed)
Called pt no answer operator stated that my call could not be reach at this time. Sent pt a Wellsite geologist.

## 2020-04-14 NOTE — Progress Notes (Unsigned)
Date last pap: 12/23/2019  Last Depo-Provera: 01/27/2020 . Side Effects if any: none Serum HCG indicated? n/a Depo-Provera 150 mg IM given by: FHampton, LPN Next appointment due May 26-August 03, 2020.

## 2020-05-04 ENCOUNTER — Other Ambulatory Visit: Payer: Self-pay

## 2020-05-04 ENCOUNTER — Encounter: Payer: Self-pay | Admitting: Emergency Medicine

## 2020-05-04 ENCOUNTER — Ambulatory Visit
Admission: EM | Admit: 2020-05-04 | Discharge: 2020-05-04 | Disposition: A | Discharge status: Home / Self Care | Payer: BC Managed Care – PPO | Attending: Emergency Medicine | Admitting: Emergency Medicine

## 2020-05-04 DIAGNOSIS — N898 Other specified noninflammatory disorders of vagina: Secondary | ICD-10-CM | POA: Insufficient documentation

## 2020-05-04 MED ORDER — METRONIDAZOLE 500 MG PO TABS: 500.0000 mg | ORAL_TABLET | Freq: Two times a day (BID) | ORAL | 0 refills | Status: AC

## 2020-05-04 NOTE — ED Provider Notes (Signed)
[facility]    CSN: [id number] Arrival date & time: 05/04/20  1500      History   Chief Complaint Chief Complaint  Patient presents with  . vaginal odor    HPI Darlene Miller is a 30 y.o. female.   Darlene Miller presents with complaints of vaginal odor she recently noted. No discharge or itching. No pelvic pain or bleeding. Has had similar in the past related to BV. Sexually active, no known exposure to stds. No regular periods related to depo.    ROS per HPI, negative if not otherwise mentioned.      Past Medical History:  Diagnosis Date  . Anxiety   . 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 Encounter for management and injection of depo-Provera 02/16/2019  . Tobacco use disorder, moderate, in early remission 02/07/2016  . 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 Anxiety disorder 02/07/2016  . Marijuana use, episodic 01/08/2016  . Papanicolaou smear of cervix with low grade squamous intraepithelial lesion (LGSIL) 02/23/2015    Past Surgical History:  Procedure Laterality Date  . CESAREAN SECTION N/A 06/08/2016   Procedure: CESAREAN SECTION;  Surgeon: Linzie Collin, MD;  Location: [facility] ORS;  Service: Obstetrics;  Laterality: N/A;  Female born @ [birth year] Weight: 5lbs  Apgars: 8/9  . NECK SURGERY     mass on neck- benign    OB History    Gravida  1   Para  1   Term  1   Preterm  0   AB  0   Living  1     SAB  0   IAB  0   Ectopic  0   Multiple  0   Live Births  1        Obstetric Comments  G1- IUGR in pregnancy         Home Medications    Prior to Admission medications   Medication Sig Start Date End Date Taking? Authorizing Provider  metroNIDAZOLE (FLAGYL) 500 MG tablet Take 1 tablet (500 mg total) by mouth 2 (two) times daily for 7 days. 05/04/20 05/11/20 Yes Michaiah Holsopple, Dorene Grebe B, NP  medroxyPROGESTERone Acetate 150  MG/ML SUSY INJECT 1 ML INTO THE MUSCLE ONCE EVERY 3 MONTHS 01/26/20   Hildred Laser, MD  valACYclovir (VALTREX) 500 MG tablet Take 1 tablet (500 mg total) by mouth 2 (two) times daily. 05/31/16   Linzie Collin, MD    Family History Family History  Problem Relation Age of Onset  . Hypertension Mother   . Restless legs syndrome Mother   . COPD Mother   . Cancer Neg Hx   . Diabetes Neg Hx   . Heart disease Neg Hx     Social History Social History   Tobacco Use  . Smoking status: Former Smoker    Packs/day: 0.25    Types: Cigarettes    Quit date: 08/06/2015    Years since quitting: 4.7  . Smokeless tobacco: Never Used  Vaping Use  . Vaping Use: Some days  . Substances: Nicotine, Flavoring  Substance Use Topics  . Alcohol use: Yes    Comment: occas  . Drug use: No     Allergies   Patient has no known allergies.   Review of Systems Review of Systems   Physical Exam Triage Vital Signs ED Triage Vitals  Enc Vitals  Group     BP 05/04/20 1603 115/79     Pulse Rate 05/04/20 1603 80     Resp 05/04/20 1603 18     Temp 05/04/20 1603 98 F (36.7 C)     Temp Source 05/04/20 1603 Oral     SpO2 05/04/20 1603 97 %     Weight --      Height --      Head Circumference --      Peak Flow --      Pain Score 05/04/20 1602 0     Pain Loc --      Pain Edu? --      Excl. in GC? --    No data found.  Updated Vital Signs BP 115/79 (BP Location: Left Arm)   Pulse 80   Temp 98 F (36.7 C) (Oral)   Resp 18   SpO2 97%   Visual Acuity Right Eye Distance:   Left Eye Distance:   Bilateral Distance:    Right Eye Near:   Left Eye Near:    Bilateral Near:     Physical Exam Constitutional:      General: She is not in acute distress.    Appearance: She is well-developed.  Cardiovascular:     Rate and Rhythm: Normal rate.  Pulmonary:     Effort: Pulmonary effort is normal.  Abdominal:     Palpations: Abdomen is not rigid.     Tenderness: There is no abdominal  tenderness. There is no guarding or rebound.  Genitourinary:    Comments: Denies sores, lesions, vaginal bleeding; no pelvic pain; gu exam deferred at this time, vaginal self swab collected.   Skin:    General: Skin is warm and dry.  Neurological:     Mental Status: She is alert and oriented to person, place, and time.      UC Treatments / Results  Labs (all labs ordered are listed, but only abnormal results are displayed) Labs Reviewed  CERVICOVAGINAL ANCILLARY ONLY    EKG   Radiology No results found.  Procedures Procedures (including critical care time)  Medications Ordered in UC Medications - No data to display  Initial Impression / Assessment and Plan / UC Course  I have reviewed the triage vital signs and the nursing notes.  Pertinent labs & imaging results that were available during my care of the patient were reviewed by me and considered in my medical decision making (see chart for details).     Vaginal cytology pending with flagyl initiated with concern for bv. Return precautions provided. Patient verbalized understanding and agreeable to plan.   Final Clinical Impressions(s) / UC Diagnoses   Final diagnoses:  Vaginal odor     Discharge Instructions     I have started medications for bacterial vaginosis pending the results of your vaginal testing.  We will notify of you any positive findings or if any changes to treatment are needed. If normal or otherwise without concern to your results, we will not call you. Please log on to your MyChart to review your results if interested in so.      ED Prescriptions    Medication Sig Dispense Auth. Provider   metroNIDAZOLE (FLAGYL) 500 MG tablet Take 1 tablet (500 mg total) by mouth 2 (two) times daily for 7 days. 14 tablet Georgetta Haber, NP     PDMP not reviewed this encounter.   Georgetta Haber, NP 05/04/20 [phone]

## 2020-05-04 NOTE — ED Triage Notes (Signed)
Pt states that she noticed vaginal odor 3-4 days ago. Pt denies any other sx.

## 2020-05-04 NOTE — Discharge Instructions (Addendum)
I have started medications for bacterial vaginosis pending the results of your vaginal testing.  We will notify of you any positive findings or if any changes to treatment are needed. If normal or otherwise without concern to your results, we will not call you. Please log on to your MyChart to review your results if interested in so.

## 2020-05-05 LAB — CERVICOVAGINAL ANCILLARY ONLY
Bacterial Vaginitis (gardnerella): POSITIVE — AB
Candida Glabrata: NEGATIVE
Candida Vaginitis: NEGATIVE
Chlamydia: NEGATIVE
Comment: NEGATIVE
Comment: NEGATIVE
Comment: NEGATIVE
Comment: NEGATIVE
Comment: NEGATIVE
Comment: NORMAL
Neisseria Gonorrhea: NEGATIVE
Trichomonas: NEGATIVE

## 2020-05-16 ENCOUNTER — Telehealth (HOSPITAL_COMMUNITY): Payer: Self-pay | Admitting: Emergency Medicine

## 2020-05-16 MED ORDER — FLUCONAZOLE 150 MG PO TABS: 150.0000 mg | ORAL_TABLET | Freq: Once | ORAL | 0 refills | Status: AC

## 2020-05-16 NOTE — Telephone Encounter (Signed)
Patient called and states she needs diflucan for developing yeast infection.  Prescription sent to pharmacy of request, per protocol

## 2020-05-19 ENCOUNTER — Telehealth: Payer: Self-pay | Admitting: Obstetrics and Gynecology

## 2020-05-19 DIAGNOSIS — O98513 Other viral diseases complicating pregnancy, third trimester: Secondary | ICD-10-CM

## 2020-05-19 DIAGNOSIS — B009 Herpesviral infection, unspecified: Secondary | ICD-10-CM

## 2020-05-19 MED ORDER — VALACYCLOVIR HCL 500 MG PO TABS: 500.0000 mg | ORAL_TABLET | Freq: Two times a day (BID) | ORAL | 6 refills | Status: DC

## 2020-05-19 NOTE — Telephone Encounter (Signed)
Pt called no answer unable to leave a message. Medication has been refilled and sent to pharmacy.

## 2020-05-19 NOTE — Telephone Encounter (Signed)
Patient called requesting a refill on Valtrex, states that her pahrmacy is [facility] in [place] on [address] She requested a call once rx is sent in. Please Advise.

## 2020-05-25 NOTE — Telephone Encounter (Signed)
Pt called unable to reach pt via phone. Sent pt a [employer] [facility].

## 2020-06-20 ENCOUNTER — Ambulatory Visit
Admission: EM | Admit: 2020-06-20 | Discharge: 2020-06-20 | Disposition: A | Discharge status: Home / Self Care | Payer: Self-pay | Attending: Internal Medicine | Admitting: Internal Medicine

## 2020-06-20 ENCOUNTER — Other Ambulatory Visit: Payer: Self-pay

## 2020-06-20 ENCOUNTER — Encounter: Payer: Self-pay | Admitting: Emergency Medicine

## 2020-06-20 DIAGNOSIS — Z113 Encounter for screening for infections with a predominantly sexual mode of transmission: Secondary | ICD-10-CM | POA: Insufficient documentation

## 2020-06-20 DIAGNOSIS — B3731 Acute candidiasis of vulva and vagina: Secondary | ICD-10-CM

## 2020-06-20 DIAGNOSIS — B373 Candidiasis of vulva and vagina: Secondary | ICD-10-CM | POA: Insufficient documentation

## 2020-06-20 MED ORDER — FLUCONAZOLE 150 MG PO TABS: 150.0000 mg | ORAL_TABLET | Freq: Every day | ORAL | 0 refills | Status: DC

## 2020-06-20 NOTE — ED Triage Notes (Signed)
Pt states that she has vaginal itching and discharge that started yesterday

## 2020-06-20 NOTE — Discharge Instructions (Signed)
Take medication as prescribed Will call with lab results and change treatment plan if needed.

## 2020-06-20 NOTE — ED Provider Notes (Signed)
[facility]    CSN: [id number] Arrival date & time: 06/20/20  1455      History   Chief Complaint Chief Complaint  Patient presents with  . Vaginal Discharge  . Vaginal Itching    HPI Darlene Miller is a 30 y.o. female.   Pt complains of vaginal itching and increased vaginal discharge that started yesterday.  Denies foul odor, pelvic pain, dysuria, flank pain, fever chills.  She has taken nothing for the sx.  Pt is sexually active with one partner.      Past Medical History:  Diagnosis Date  . Anxiety   . 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 Encounter for management and injection of depo-Provera 02/16/2019  . Tobacco use disorder, moderate, in early remission 02/07/2016  . 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 Anxiety disorder 02/07/2016  . Marijuana use, episodic 01/08/2016  . Papanicolaou smear of cervix with low grade squamous intraepithelial lesion (LGSIL) 02/23/2015    Past Surgical History:  Procedure Laterality Date  . CESAREAN SECTION N/A 06/08/2016   Procedure: CESAREAN SECTION;  Surgeon: Linzie Collin, MD;  Location: [facility] ORS;  Service: Obstetrics;  Laterality: N/A;  Female born @ [birth year] Weight: 5lbs  Apgars: 8/9  . NECK SURGERY     mass on neck- benign    OB History    Gravida  1   Para  1   Term  1   Preterm  0   AB  0   Living  1     SAB  0   IAB  0   Ectopic  0   Multiple  0   Live Births  1        Obstetric Comments  G1- IUGR in pregnancy         Home Medications    Prior to Admission medications   Medication Sig Start Date End Date Taking? Authorizing Provider  fluconazole (DIFLUCAN) 150 MG tablet Take 1 tablet (150 mg total) by mouth daily. Take 1 tablet by mouth, if no improvement after 72 hours can repeat second tablet. 06/20/20  Yes Brin Ruggerio, PA-C  medroxyPROGESTERone Acetate 150  MG/ML SUSY INJECT 1 ML INTO THE MUSCLE ONCE EVERY 3 MONTHS 01/26/20   Hildred Laser, MD  valACYclovir (VALTREX) 500 MG tablet Take 1 tablet (500 mg total) by mouth 2 (two) times daily. 05/19/20   Hildred Laser, MD    Family History Family History  Problem Relation Age of Onset  . Hypertension Mother   . Restless legs syndrome Mother   . COPD Mother   . Cancer Neg Hx   . Diabetes Neg Hx   . Heart disease Neg Hx     Social History Social History   Tobacco Use  . Smoking status: Former Smoker    Packs/day: 0.25    Types: Cigarettes    Quit date: 08/06/2015    Years since quitting: 4.8  . Smokeless tobacco: Never Used  Vaping Use  . Vaping Use: Some days  . Substances: Nicotine, Flavoring  Substance Use Topics  . Alcohol use: Yes    Comment: occas  . Drug use: No     Allergies   Patient has no known allergies.   Review of Systems Review of Systems  Constitutional: Negative for chills and fever.  HENT: Negative for ear pain and sore throat.  Eyes: Negative for pain and visual disturbance.  Respiratory: Negative for cough and shortness of breath.   Cardiovascular: Negative for chest pain and palpitations.  Gastrointestinal: Negative for abdominal pain and vomiting.  Genitourinary: Positive for vaginal discharge. Negative for dysuria and hematuria.       Vaginal itching  Musculoskeletal: Negative for arthralgias and back pain.  Skin: Negative for color change and rash.  Neurological: Negative for seizures and syncope.  All other systems reviewed and are negative.    Physical Exam Triage Vital Signs ED Triage Vitals  Enc Vitals Group     BP 06/20/20 1702 109/73     Pulse Rate 06/20/20 1702 76     Resp 06/20/20 1702 18     Temp 06/20/20 1702 98.6 F (37 C)     Temp Source 06/20/20 1702 Oral     SpO2 06/20/20 1702 98 %     Weight --      Height --      Head Circumference --      Peak Flow --      Pain Score 06/20/20 1700 0     Pain Loc --      Pain Edu? --       Excl. in GC? --    No data found.  Updated Vital Signs BP 109/73   Pulse 76   Temp 98.6 F (37 C) (Oral)   Resp 18   SpO2 98%   Visual Acuity Right Eye Distance:   Left Eye Distance:   Bilateral Distance:    Right Eye Near:   Left Eye Near:    Bilateral Near:     Physical Exam Vitals and nursing note reviewed.  Constitutional:      General: She is not in acute distress.    Appearance: She is well-developed.  HENT:     Head: Normocephalic and atraumatic.  Eyes:     Conjunctiva/sclera: Conjunctivae normal.  Cardiovascular:     Rate and Rhythm: Normal rate and regular rhythm.     Heart sounds: No murmur heard.   Pulmonary:     Effort: Pulmonary effort is normal. No respiratory distress.     Breath sounds: Normal breath sounds.  Abdominal:     Palpations: Abdomen is soft.     Tenderness: There is no abdominal tenderness.  Musculoskeletal:     Cervical back: Neck supple.  Skin:    General: Skin is warm and dry.  Neurological:     Mental Status: She is alert.      UC Treatments / Results  Labs (all labs ordered are listed, but only abnormal results are displayed) Labs Reviewed  WET PREP, GENITAL    EKG   Radiology No results found.  Procedures Procedures (including critical care time)  Medications Ordered in UC Medications - No data to display  Initial Impression / Assessment and Plan / UC Course  I have reviewed the triage vital signs and the nursing notes.  Pertinent labs & imaging results that were available during my care of the patient were reviewed by me and considered in my medical decision making (see chart for details).     Will treat for vaginal candidiasis.  Labs pending, will change treatment plan if needed.  Final Clinical Impressions(s) / UC Diagnoses   Final diagnoses:  Vaginal yeast infection  Routine screening for STI (sexually transmitted infection)     Discharge Instructions     Take medication as  prescribed Will call with lab results and  change treatment plan if needed.    ED Prescriptions    Medication Sig Dispense Auth. Provider   fluconazole (DIFLUCAN) 150 MG tablet Take 1 tablet (150 mg total) by mouth daily. Take 1 tablet by mouth, if no improvement after 72 hours can repeat second tablet. 2 tablet Jodell Cipro, PA-C     PDMP not reviewed this encounter.   Jodell Cipro, PA-C 06/20/20 1721

## 2020-06-22 ENCOUNTER — Telehealth: Payer: Self-pay | Admitting: Emergency Medicine

## 2020-06-22 NOTE — Telephone Encounter (Signed)
Wrong order placed from visit yesterday. Placing new order to send swab off.

## 2020-06-24 LAB — CERVICOVAGINAL ANCILLARY ONLY
Bacterial Vaginitis (gardnerella): NEGATIVE
Candida Glabrata: NEGATIVE
Candida Vaginitis: POSITIVE — AB
Chlamydia: NEGATIVE
Comment: NEGATIVE
Comment: NEGATIVE
Comment: NEGATIVE
Comment: NEGATIVE
Comment: NEGATIVE
Comment: NORMAL
Neisseria Gonorrhea: NEGATIVE
Trichomonas: NEGATIVE

## 2020-07-14 ENCOUNTER — Other Ambulatory Visit: Payer: Self-pay

## 2020-07-14 ENCOUNTER — Ambulatory Visit (INDEPENDENT_AMBULATORY_CARE_PROVIDER_SITE_OTHER): Payer: Self-pay

## 2020-07-14 VITALS — Ht 60.0 in

## 2020-07-14 DIAGNOSIS — Z3042 Encounter for surveillance of injectable contraceptive: Secondary | ICD-10-CM

## 2020-07-14 MED ORDER — MEDROXYPROGESTERONE ACETATE 150 MG/ML IM SUSP
150.0000 mg | Freq: Once | INTRAMUSCULAR | Status: AC
  Administered 2020-07-14: 150 mg via INTRAMUSCULAR

## 2020-07-14 NOTE — Progress Notes (Signed)
Date last pap: 12/23/2019 Last Depo-Provera: 04/14/2020. Side Effects if any: none Serum HCG indicated? N/A. Depo-Provera 150 mg IM given by: [place], LPN. Next appointment due August 25-Sept 8, 2022.   Patient supplied medication.

## 2020-07-15 ENCOUNTER — Ambulatory Visit: Payer: Self-pay

## 2020-09-02 ENCOUNTER — Other Ambulatory Visit: Payer: Self-pay

## 2020-09-02 ENCOUNTER — Ambulatory Visit
Admission: EM | Admit: 2020-09-02 | Discharge: 2020-09-02 | Disposition: A | Discharge status: Home / Self Care | Payer: PRIVATE HEALTH INSURANCE | Attending: Physician Assistant | Admitting: Physician Assistant

## 2020-09-02 DIAGNOSIS — R21 Rash and other nonspecific skin eruption: Secondary | ICD-10-CM | POA: Diagnosis not present

## 2020-09-02 DIAGNOSIS — L500 Allergic urticaria: Secondary | ICD-10-CM | POA: Diagnosis not present

## 2020-09-02 MED ORDER — CETIRIZINE HCL 10 MG PO TABS: 10.0000 mg | ORAL_TABLET | Freq: Every day | ORAL | 0 refills | Status: DC

## 2020-09-02 MED ORDER — FAMOTIDINE 40 MG PO TABS: 40.0000 mg | ORAL_TABLET | Freq: Every day | ORAL | 0 refills | Status: DC

## 2020-09-02 MED ORDER — METHYLPREDNISOLONE ACETATE 80 MG/ML IJ SUSP
60.0000 mg | Freq: Once | INTRAMUSCULAR | Status: AC
  Administered 2020-09-02: 60 mg via INTRAMUSCULAR

## 2020-09-02 NOTE — ED Triage Notes (Signed)
Pt c/o bilat facial rash lasting approx 1 week. States burning and itching. Has tried new aveeno lotion but has started it x1 month ago without rash until last week. Has tried benadryl without relief.

## 2020-09-02 NOTE — Discharge Instructions (Addendum)
You are given an injection of steroids today.  Start taking Zyrtec in the morning and Pepcid at night.  Avoid using Aveeno lotion.  Use hypoallergenic soaps and detergents.  If anything worsens especially if you develop any chest pain, shortness of breath, difficulty swallowing, difficulty speaking please go to the emergency room.

## 2020-09-02 NOTE — ED Provider Notes (Signed)
[facility]    CSN: [id number] Arrival date & time: 09/02/20  1108      History   Chief Complaint Chief Complaint  Patient presents with   Rash    face    HPI Darlene Miller is a 30 y.o. female.   Patient presents today with a 1 week history of pruritic rash bilateral cheeks.  She denies any change to personal hygiene products including soaps or detergents prior to symptom onset.  Does report that she switched to a new lotion but this was approximately 1 month ago and did not have any episodes until recently.  She does report going camping just prior to symptom onset but denies any exposure to insects or poison ivy.  She has not tried any over-the-counter medications for symptom management.  She denies any history of dermatological condition.  She denies history of severe allergies including anaphylaxis.  Denies any shortness of breath, difficulty speaking, difficulty swallowing.   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   Anxiety disorder 02/07/2016   Marijuana use, episodic 01/08/2016   Papanicolaou smear of cervix with low grade squamous intraepithelial lesion (LGSIL) 02/23/2015    Past Surgical History:  Procedure Laterality Date   CESAREAN SECTION N/A 06/08/2016   Procedure: CESAREAN SECTION;  Surgeon: Linzie Collin, MD;  Location: [facility] ORS;  Service: Obstetrics;  Laterality: N/A;  Female born @ [birth year] Weight: 5lbs  Apgars: 8/9   NECK SURGERY     mass on neck- benign    OB History     Gravida  1   Para  1   Term  1   Preterm  0   AB  0   Living  1      SAB  0   IAB  0   Ectopic  0   Multiple  0   Live Births   1        Obstetric Comments  G1- IUGR in pregnancy          Home Medications    Prior to Admission medications   Medication Sig Start Date End Date Taking? Authorizing Provider  cetirizine (ZYRTEC ALLERGY) 10 MG tablet Take 1 tablet (10 mg total) by mouth daily. 09/02/20  Yes Chyan Carnero K, PA-C  famotidine (PEPCID) 40 MG tablet Take 1 tablet (40 mg total) by mouth at bedtime. 09/02/20  Yes Elloise Roark K, PA-C  medroxyPROGESTERone Acetate 150 MG/ML SUSY INJECT 1 ML INTO THE MUSCLE ONCE EVERY 3 MONTHS 01/26/20   Hildred Laser, MD  valACYclovir (VALTREX) 500 MG tablet Take 1 tablet (500 mg total) by mouth 2 (two) times daily. 05/19/20   Hildred Laser, MD    Family History Family History  Problem Relation Age of Onset   Hypertension Mother    Restless legs syndrome Mother    COPD Mother    Cancer Neg Hx    Diabetes Neg Hx    Heart disease Neg Hx     Social History Social History   Tobacco Use   Smoking status: Former    Packs/day: 0.25    Types: Cigarettes    Quit date: 08/06/2015  Years since quitting: 5.0   Smokeless tobacco: Never  Vaping Use   Vaping Use: Some days   Substances: Nicotine, Flavoring  Substance Use Topics   Alcohol use: Yes    Comment: occas   Drug use: No     Allergies   Patient has no known allergies.   Review of Systems Review of Systems  Constitutional:  Negative for activity change, appetite change, fatigue and fever.  HENT:  Negative for congestion, sinus pressure, sneezing and sore throat.   Respiratory:  Negative for cough and shortness of breath.   Cardiovascular:  Negative for chest pain.  Gastrointestinal:  Negative for abdominal pain, diarrhea, nausea and vomiting.  Skin:  Positive for rash.  Neurological:  Negative for dizziness, light-headedness and headaches.    Physical Exam Triage Vital Signs ED Triage Vitals  Enc Vitals Group     BP 09/02/20 1121 114/71     Pulse Rate 09/02/20 1121 65     Resp 09/02/20 1121 15      Temp 09/02/20 1121 98.1 F (36.7 C)     Temp Source 09/02/20 1121 Oral     SpO2 09/02/20 1121 98 %     Weight --      Height --      Head Circumference --      Peak Flow --      Pain Score 09/02/20 1122 0     Pain Loc --      Pain Edu? --      Excl. in GC? --    No data found.  Updated Vital Signs BP 114/71 (BP Location: Left Arm)   Pulse 65   Temp 98.1 F (36.7 C) (Oral)   Resp 15   LMP 08/22/2020 (Approximate)   SpO2 98%   Visual Acuity Right Eye Distance:   Left Eye Distance:   Bilateral Distance:    Right Eye Near:   Left Eye Near:    Bilateral Near:     Physical Exam Vitals reviewed.  Constitutional:      General: She is awake. She is not in acute distress.    Appearance: Normal appearance. She is normal weight. She is not ill-appearing.     Comments: Very pleasant female appears stated age no acute distress sitting comfortably in exam room  HENT:     Head: Normocephalic and atraumatic.     Mouth/Throat:     Mouth: Mucous membranes are moist.     Pharynx: Uvula midline. No oropharyngeal exudate or posterior oropharyngeal erythema.  Cardiovascular:     Rate and Rhythm: Normal rate and regular rhythm.     Heart sounds: Normal heart sounds, S1 normal and S2 normal. No murmur heard. Pulmonary:     Effort: Pulmonary effort is normal.     Breath sounds: Normal breath sounds. No wheezing, rhonchi or rales.  Abdominal:     Palpations: Abdomen is soft.     Tenderness: There is no abdominal tenderness.  Skin:    Findings: Rash present. Rash is macular and papular.     Comments: Maculopapular rash noted face with radiation to neck.  No vesicles noted.  No streaking or evidence of lymphangitis.  No bleeding or drainage noted.  Psychiatric:        Behavior: Behavior is cooperative.     UC Treatments / Results  Labs (all labs ordered are listed, but only abnormal results are displayed) Labs Reviewed - No data to display  EKG   Radiology No results  found.  Procedures Procedures (including critical care time)  Medications Ordered in UC Medications  methylPREDNISolone acetate (DEPO-MEDROL) injection 60 mg (has no administration in time range)    Initial Impression / Assessment and Plan / UC Course  I have reviewed the triage vital signs and the nursing notes.  Pertinent labs & imaging results that were available during my care of the patient were reviewed by me and considered in my medical decision making (see chart for details).      Rash has allergic appearance.  We will give patient 60 mg of Depo-Medrol.  Patient denies any history of diabetes.  We will start alternating H1 and H2 blockers to help manage symptoms.  Zyrtec and Pepcid sent to pharmacy.  She was encouraged use hypoallergenic soaps and detergents and avoid Aveeno lotion that she was using.  Discussed at length alarm symptoms that warrant emergent evaluation.  Strict return precautions given to which patient expressed understanding.    Final Clinical Impressions(s) / UC Diagnoses   Final diagnoses:  Allergic urticaria  Rash     Discharge Instructions      You are given an injection of steroids today.  Start taking Zyrtec in the morning and Pepcid at night.  Avoid using Aveeno lotion.  Use hypoallergenic soaps and detergents.  If anything worsens especially if you develop any chest pain, shortness of breath, difficulty swallowing, difficulty speaking please go to the emergency room.     ED Prescriptions     Medication Sig Dispense Auth. Provider   famotidine (PEPCID) 40 MG tablet Take 1 tablet (40 mg total) by mouth at bedtime. 30 tablet Arlys Scatena K, PA-C   cetirizine (ZYRTEC ALLERGY) 10 MG tablet Take 1 tablet (10 mg total) by mouth daily. 30 tablet Tayden Duran, Noberto Retort, PA-C      PDMP not reviewed this encounter.   Jeani Hawking, PA-C 09/02/20 1212

## 2020-09-14 ENCOUNTER — Other Ambulatory Visit: Payer: Self-pay

## 2020-09-14 ENCOUNTER — Ambulatory Visit
Admission: EM | Admit: 2020-09-14 | Discharge: 2020-09-14 | Disposition: A | Discharge status: Home / Self Care | Payer: PRIVATE HEALTH INSURANCE | Attending: Urgent Care | Admitting: Urgent Care

## 2020-09-14 DIAGNOSIS — R21 Rash and other nonspecific skin eruption: Secondary | ICD-10-CM | POA: Diagnosis not present

## 2020-09-14 DIAGNOSIS — L299 Pruritus, unspecified: Secondary | ICD-10-CM

## 2020-09-14 MED ORDER — PREDNISONE 20 MG PO TABS: ORAL_TABLET | ORAL | 0 refills | Status: DC

## 2020-09-14 MED ORDER — HYDROXYZINE HCL 25 MG PO TABS: 12.5000 mg | ORAL_TABLET | Freq: Three times a day (TID) | ORAL | 0 refills | Status: DC | PRN

## 2020-09-14 MED ORDER — TRIAMCINOLONE ACETONIDE 0.1 % EX CREA: 1.0000 "application " | TOPICAL_CREAM | Freq: Two times a day (BID) | CUTANEOUS | 0 refills | Status: DC

## 2020-09-14 NOTE — ED Provider Notes (Signed)
[facility]   MRN: [id number] DOB: 09/05/1990  Subjective:   Darlene Miller is a 30 y.o. female presenting for persistent pruritic rash.  Initially this was over her face but now is over her neck area to a lesser extent on her arms.  Initially patient was seen on 09/02/2020, underwent a steroid injection with IM Depo-Medrol and has been using antihistamines prescribed.  She states she achieves some improvement but not resolution as the rash is now over her neck, chest and arms. Denies eating any new foods, starting new medications, exposure to poisonous plants, new hygiene products, new cleaning products or detergents.  Her daughter who lives with her at home does not have any rash at all.  Does not have any concern about bedbugs.  No current facility-administered medications for this encounter.  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     Past Surgical History:  Procedure Laterality Date   CESAREAN SECTION N/A 06/08/2016   Procedure: CESAREAN SECTION;  Surgeon: Linzie Collin, MD;  Location: [facility] ORS;  Service: Obstetrics;  Laterality: N/A;  Female born @ [birth year] Weight: 5lbs  Apgars: 8/9   NECK SURGERY     mass on neck- benign    Family History  Problem Relation Age of Onset   Hypertension Mother    Restless legs syndrome Mother    COPD Mother    Cancer Neg Hx    Diabetes  Neg Hx    Heart disease Neg Hx     Social History   Tobacco Use   Smoking status: Former    Packs/day: 0.25    Types: Cigarettes    Quit date: 08/06/2015    Years since quitting: 5.1   Smokeless tobacco: Never  Vaping Use   Vaping Use: Some days   Substances: Nicotine, Flavoring  Substance Use Topics   Alcohol use: Yes    Comment: occas   Drug use: No    ROS   Objective:   Vitals: BP 105/68 (BP Location: Right Arm)   Pulse 76   Temp 98.5 F (36.9 C) (Oral)   Resp 18   LMP 08/22/2020 (Approximate)   SpO2 98%   Physical Exam Constitutional:      General: She is not in acute distress.    Appearance: Normal appearance. She is well-developed. She is not ill-appearing, toxic-appearing or diaphoretic.  HENT:     Head: Normocephalic and atraumatic.     Right Ear: External ear normal.     Left Ear: External ear normal.     Nose: Nose normal.     Mouth/Throat:     Mouth: Mucous membranes are moist.     Pharynx: Oropharynx is clear. No oropharyngeal exudate or posterior oropharyngeal erythema.  Eyes:     General: No scleral icterus.  Right eye: No discharge.        Left eye: No discharge.     Extraocular Movements: Extraocular movements intact.     Conjunctiva/sclera: Conjunctivae normal.     Pupils: Pupils are equal, round, and reactive to light.  Cardiovascular:     Rate and Rhythm: Normal rate.  Pulmonary:     Effort: Pulmonary effort is normal.  Skin:    General: Skin is warm and dry.     Coloration: Skin is not pale.     Findings: Rash (Papular urticarial type rash over the neck, chest and to a lesser extent on the upper extremities proximally) present. No bruising, erythema or lesion.  Neurological:     General: No focal deficit present.     Mental Status: She is alert and oriented to person, place, and time.  Psychiatric:        Mood and Affect: Mood normal.        Behavior: Behavior normal.        Thought Content: Thought content normal.         Judgment: Judgment normal.    Assessment and Plan :   PDMP not reviewed this encounter.  1. Rash and nonspecific skin eruption   2. Itching     Will use one more round of steroids. Start hydroxyzine. Once oral steroids are completed then switch to topical steroids. Follow up with dermatologist and/or allergist. Emphasized need to monitor her exposures at home as diligently as possible. Switch to products for gentle skin. Counseled patient on potential for adverse effects with medications prescribed/recommended today, ER and return-to-clinic precautions discussed, patient verbalized understanding.    Wallis Bamberg, [place] 09/14/20 1614

## 2020-09-14 NOTE — ED Triage Notes (Signed)
Pt c/o rash that appears across mid face horizontally, around right ear, and on the right clavicle region. States was at this UC a couple weeks ago for this same rash and the treatment provided temporary relief but not resolution. Pt appears anxious to find a permanent solution. Rash appears pinkish, often dry in specific areas - in front of right ear, irregular edges, not particularly raised. Pt c/o "prickly" sensation that is painful and keeps her up at night.

## 2020-12-06 ENCOUNTER — Encounter: Payer: Self-pay | Admitting: Emergency Medicine

## 2020-12-06 ENCOUNTER — Ambulatory Visit
Admission: EM | Admit: 2020-12-06 | Discharge: 2020-12-06 | Disposition: A | Discharge status: Home / Self Care | Payer: PRIVATE HEALTH INSURANCE | Attending: Internal Medicine | Admitting: Internal Medicine

## 2020-12-06 ENCOUNTER — Other Ambulatory Visit: Payer: Self-pay

## 2020-12-06 DIAGNOSIS — N898 Other specified noninflammatory disorders of vagina: Secondary | ICD-10-CM | POA: Insufficient documentation

## 2020-12-06 DIAGNOSIS — Z113 Encounter for screening for infections with a predominantly sexual mode of transmission: Secondary | ICD-10-CM | POA: Insufficient documentation

## 2020-12-06 NOTE — ED Provider Notes (Signed)
[facility]    CSN: [id number] Arrival date & time: 12/06/20  0805      History   Chief Complaint Chief Complaint  Patient presents with   Vaginal Discharge    HPI Darlene Miller is a 30 y.o. female.   Patient presents with white vaginal discharge that started approximately 3 to 4 days ago.  Patient denies any urinary burning, urinary frequency, pelvic pain, fever, abdominal pain, back pain, irregular vaginal bleeding.  Patient denies any known exposure to STD but has had unprotected sexual intercourse recently.  Patient does not report any irregular menstrual cycles.  Patient also wishes to have testing for HIV and syphilis.   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   Anxiety disorder 02/07/2016   Marijuana use, episodic 01/08/2016   Papanicolaou smear of cervix with low grade squamous intraepithelial lesion (LGSIL) 02/23/2015    Past Surgical History:  Procedure Laterality Date   CESAREAN SECTION N/A 06/08/2016   Procedure: CESAREAN SECTION;  Surgeon: Linzie Collin, MD;  Location: [facility] ORS;  Service: Obstetrics;  Laterality: N/A;  Female born @ [birth year] Weight: 5lbs  Apgars: 8/9   NECK SURGERY     mass on neck- benign    OB History     Gravida  1   Para  1   Term  1   Preterm  0   AB  0   Living  1      SAB  0   IAB  0   Ectopic  0   Multiple  0   Live Births  1        Obstetric Comments  G1- IUGR in pregnancy          Home Medications    Prior to Admission medications   Medication Sig Start Date End Date Taking? Authorizing Provider  cetirizine (ZYRTEC ALLERGY) 10 MG tablet Take  1 tablet (10 mg total) by mouth daily. 09/02/20   Raspet, Noberto Retort, PA-C  famotidine (PEPCID) 40 MG tablet Take 1 tablet (40 mg total) by mouth at bedtime. 09/02/20   Raspet, Noberto Retort, PA-C  hydrOXYzine (ATARAX/VISTARIL) 25 MG tablet Take 0.5-1 tablets (12.5-25 mg total) by mouth every 8 (eight) hours as needed for itching. 09/14/20   Wallis Bamberg, PA-C  medroxyPROGESTERone Acetate 150 MG/ML SUSY INJECT 1 ML INTO THE MUSCLE ONCE EVERY 3 MONTHS 01/26/20   Hildred Laser, MD  predniSONE (DELTASONE) 20 MG tablet Take 2 tablets daily with breakfast. 09/14/20   Wallis Bamberg, PA-C  triamcinolone cream (KENALOG) 0.1 % Apply 1 application topically 2 (two) times daily. 09/14/20   Wallis Bamberg, PA-C  valACYclovir (VALTREX) 500 MG tablet Take 1 tablet (500 mg total) by mouth 2 (two) times daily. 05/19/20   Hildred Laser, MD    Family History Family History  Problem Relation Age of Onset   Hypertension Mother    Restless legs syndrome Mother    COPD Mother    Cancer Neg Hx    Diabetes Neg Hx    Heart disease Neg Hx     Social History Social History   Tobacco Use  Smoking status: Former    Packs/day: 0.25    Types: Cigarettes    Quit date: 08/06/2015    Years since quitting: 5.3   Smokeless tobacco: Never  Vaping Use   Vaping Use: Some days   Substances: Nicotine, Flavoring  Substance Use Topics   Alcohol use: Yes    Comment: occas   Drug use: No     Allergies   Patient has no known allergies.   Review of Systems Review of Systems Per HPI  Physical Exam Triage Vital Signs ED Triage Vitals [12/06/20 0827]  Enc Vitals Group     BP 113/76     Pulse Rate (!) 57     Resp 16     Temp 97.9 F (36.6 C)     Temp Source Oral     SpO2 98 %     Weight      Height      Head Circumference      Peak Flow      Pain Score 0     Pain Loc      Pain Edu?      Excl. in [place]?    No data found.  Updated Vital Signs BP 113/76 (BP Location: Left Arm)   Pulse (!) 57   Temp 97.9 F (36.6 C) (Oral)    Resp 16   SpO2 98%   Visual Acuity Right Eye Distance:   Left Eye Distance:   Bilateral Distance:    Right Eye Near:   Left Eye Near:    Bilateral Near:     Physical Exam Constitutional:      General: She is not in acute distress.    Appearance: Normal appearance. She is not toxic-appearing or diaphoretic.  HENT:     Head: Normocephalic and atraumatic.  Eyes:     Extraocular Movements: Extraocular movements intact.     Conjunctiva/sclera: Conjunctivae normal.  Pulmonary:     Effort: Pulmonary effort is normal.  Genitourinary:    Comments: Deferred with shared decision making.  Self swab performed. Neurological:     General: No focal deficit present.     Mental Status: She is alert and oriented to person, place, and time. Mental status is at baseline.  Psychiatric:        Mood and Affect: Mood normal.        Behavior: Behavior normal.        Thought Content: Thought content normal.        Judgment: Judgment normal.     UC Treatments / Results  Labs (all labs ordered are listed, but only abnormal results are displayed) Labs Reviewed  HIV ANTIBODY (ROUTINE TESTING W REFLEX)  RPR  CERVICOVAGINAL ANCILLARY ONLY    EKG   Radiology No results found.  Procedures Procedures (including critical care time)  Medications Ordered in UC Medications - No data to display  Initial Impression / Assessment and Plan / UC Course  I have reviewed the triage vital signs and the nursing notes.  Pertinent labs & imaging results that were available during my care of the patient were reviewed by me and considered in my medical decision making (see chart for details).     Will await vaginal swab results for treatment.  Patient to refrain from sexual activity until test results are complete.  Blood work pending per patient request.  No red flags on exam.  Discussed strict return precautions.  Patient verbalized understanding and was agreeable with plan. Final Clinical  Impressions(s) /  UC Diagnoses   Final diagnoses:  Vaginal discharge  Screening examination for venereal disease     Discharge Instructions      Your vaginal swab and blood work are pending.  We will call if results are positive and treat as appropriate.  Please refrain from sexual activity until test results and treatment are complete.     ED Prescriptions   None    PDMP not reviewed this encounter.   Teodora Medici, [facility] 12/06/20 [phone]

## 2020-12-06 NOTE — Discharge Instructions (Signed)
Your vaginal swab and blood work are pending.  We will call if results are positive and treat as appropriate.  Please refrain from sexual activity until test results and treatment are complete.

## 2020-12-06 NOTE — ED Triage Notes (Addendum)
Noticed white discharge that has an odor in vaginal area. Denies urinary symptoms. Requesting STD check with blood work

## 2020-12-07 ENCOUNTER — Telehealth (HOSPITAL_COMMUNITY): Payer: Self-pay | Admitting: Emergency Medicine

## 2020-12-07 LAB — CERVICOVAGINAL ANCILLARY ONLY
Bacterial Vaginitis (gardnerella): POSITIVE — AB
Candida Glabrata: NEGATIVE
Candida Vaginitis: NEGATIVE
Chlamydia: NEGATIVE
Comment: NEGATIVE
Comment: NEGATIVE
Comment: NEGATIVE
Comment: NEGATIVE
Comment: NEGATIVE
Comment: NORMAL
Neisseria Gonorrhea: NEGATIVE
Trichomonas: NEGATIVE

## 2020-12-07 LAB — RPR: RPR Ser Ql: NONREACTIVE

## 2020-12-07 LAB — HIV ANTIBODY (ROUTINE TESTING W REFLEX): HIV Screen 4th Generation wRfx: NONREACTIVE

## 2020-12-07 MED ORDER — METRONIDAZOLE 500 MG PO TABS: 500.0000 mg | ORAL_TABLET | Freq: Two times a day (BID) | ORAL | 0 refills | Status: DC

## 2020-12-23 ENCOUNTER — Emergency Department (HOSPITAL_COMMUNITY): Payer: BC Managed Care – PPO

## 2020-12-23 ENCOUNTER — Encounter (HOSPITAL_COMMUNITY): Payer: Self-pay | Admitting: Emergency Medicine

## 2020-12-23 ENCOUNTER — Other Ambulatory Visit: Payer: Self-pay

## 2020-12-23 ENCOUNTER — Emergency Department (HOSPITAL_COMMUNITY)
Admission: EM | Admit: 2020-12-23 | Discharge: 2020-12-24 | Disposition: A | Discharge status: Home / Self Care | Payer: BC Managed Care – PPO | Attending: Emergency Medicine | Admitting: Emergency Medicine

## 2020-12-23 DIAGNOSIS — S52502A Unspecified fracture of the lower end of left radius, initial encounter for closed fracture: Secondary | ICD-10-CM | POA: Diagnosis not present

## 2020-12-23 DIAGNOSIS — S6992XA Unspecified injury of left wrist, hand and finger(s), initial encounter: Secondary | ICD-10-CM | POA: Diagnosis not present

## 2020-12-23 DIAGNOSIS — Z87891 Personal history of nicotine dependence: Secondary | ICD-10-CM | POA: Diagnosis not present

## 2020-12-23 DIAGNOSIS — Y9351 Activity, roller skating (inline) and skateboarding: Secondary | ICD-10-CM | POA: Insufficient documentation

## 2020-12-23 DIAGNOSIS — R52 Pain, unspecified: Secondary | ICD-10-CM

## 2020-12-23 MED ORDER — OXYCODONE-ACETAMINOPHEN 5-325 MG PO TABS
1.0000 | ORAL_TABLET | Freq: Once | ORAL | Status: AC
  Administered 2020-12-23: 22:00:00 1 via ORAL
  Filled 2020-12-23: qty 1

## 2020-12-23 NOTE — ED Triage Notes (Signed)
Patient here after a fall, deformity to right wrist.

## 2020-12-24 ENCOUNTER — Emergency Department (HOSPITAL_COMMUNITY): Payer: BC Managed Care – PPO

## 2020-12-24 MED ORDER — HYDROMORPHONE HCL 1 MG/ML IJ SOLN
1.0000 mg | Freq: Once | INTRAMUSCULAR | Status: AC
  Administered 2020-12-24: 1 mg via INTRAVENOUS
  Filled 2020-12-24: qty 1

## 2020-12-24 MED ORDER — OXYCODONE-ACETAMINOPHEN 5-325 MG PO TABS: 2.0000 | ORAL_TABLET | ORAL | 0 refills | Status: DC | PRN

## 2020-12-24 MED ORDER — LIDOCAINE HCL 2 % IJ SOLN
20.0000 mL | Freq: Once | INTRAMUSCULAR | Status: AC
  Administered 2020-12-24: 400 mg
  Filled 2020-12-24: qty 20

## 2020-12-24 MED ORDER — OXYCODONE-ACETAMINOPHEN 5-325 MG PO TABS
2.0000 | ORAL_TABLET | Freq: Once | ORAL | Status: AC
  Administered 2020-12-24: 2 via ORAL
  Filled 2020-12-24: qty 2

## 2020-12-24 NOTE — Progress Notes (Signed)
Orthopedic Tech Progress Note Patient Details:  Darlene Miller 1990/10/16 [id number]  Ortho Devices Type of Ortho Device: Finger trap, Sugartong splint, Arm sling Finger Trap Weight: 6 Ortho Device/Splint Location: LUE Ortho Device/Splint Interventions: Ordered, Application, Adjustment   Post Interventions Patient Tolerated: Well Instructions Provided: Adjustment of device, Care of device, Poper ambulation with device  [name] 12/24/2020, 5:23 AM

## 2020-12-24 NOTE — ED Notes (Signed)
Pt placed in finger traps

## 2020-12-24 NOTE — ED Provider Notes (Signed)
[facility] EMERGENCY DEPARTMENT Provider Note   CSN: [id number] Arrival date & time: 12/23/20  2143     History Chief Complaint  Patient presents with   Fall   Wrist Pain    Darlene Miller is a 30 y.o. female.  30 year old female who was rollerskating when she fell and subsequently hurt her left wrist.  She states that she did not hurt a thing else.  Happened just prior to arrival.  No medical history.  No surgical history.  No other associated symptoms.   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   Anxiety disorder 02/07/2016   Marijuana use, episodic 01/08/2016   Papanicolaou smear of cervix with low grade squamous intraepithelial lesion (LGSIL) 02/23/2015    Past Surgical History:  Procedure Laterality Date   CESAREAN SECTION N/A 06/08/2016   Procedure: CESAREAN SECTION;  Surgeon: Linzie Collin, MD;  Location: [facility] ORS;  Service: Obstetrics;  Laterality: N/A;  Female born @ [birth year] Weight: 5lbs  Apgars: 8/9   NECK SURGERY     mass on neck- benign     OB History     Gravida  1   Para  1   Term  1   Preterm  0   AB  0   Living  1      SAB  0   IAB  0   Ectopic  0   Multiple  0   Live Births  1        Obstetric Comments  G1- IUGR in pregnancy         Family History  Problem Relation Age of Onset   Hypertension Mother    Restless legs syndrome Mother    COPD Mother    Cancer Neg Hx    Diabetes Neg Hx    Heart disease Neg Hx     Social History   Tobacco Use   Smoking status: Former    Packs/day: 0.25    Types: Cigarettes    Quit date: 08/06/2015    Years since  quitting: 5.3   Smokeless tobacco: Never  Vaping Use   Vaping Use: Some days   Substances: Nicotine, Flavoring  Substance Use Topics   Alcohol use: Yes    Comment: occas   Drug use: No    Home Medications Prior to Admission medications   Medication Sig Start Date End Date Taking? Authorizing Provider  oxyCODONE-acetaminophen (PERCOCET) 5-325 MG tablet Take 2 tablets by mouth every 4 (four) hours as needed. 12/24/20  Yes Rayland Hamed, Barbara Cower, MD  cetirizine (ZYRTEC ALLERGY) 10 MG tablet Take 1 tablet (10 mg total) by mouth daily. 09/02/20   Raspet, Noberto Retort, PA-C  famotidine (PEPCID) 40 MG tablet Take 1 tablet (40 mg total) by mouth at bedtime. 09/02/20   Raspet, Noberto Retort, PA-C  hydrOXYzine (ATARAX/VISTARIL) 25 MG tablet Take 0.5-1 tablets (12.5-25 mg total) by mouth every 8 (eight) hours as needed for itching. 09/14/20   Wallis Bamberg, PA-C  medroxyPROGESTERone Acetate 150 MG/ML SUSY INJECT 1 ML INTO THE MUSCLE ONCE EVERY 3 MONTHS 01/26/20   Hildred Laser,  MD  metroNIDAZOLE (FLAGYL) 500 MG tablet Take 1 tablet (500 mg total) by mouth 2 (two) times daily. 12/07/20   Chase Picket, MD  predniSONE (DELTASONE) 20 MG tablet Take 2 tablets daily with breakfast. 09/14/20   Jaynee Eagles, PA-C  triamcinolone cream (KENALOG) 0.1 % Apply 1 application topically 2 (two) times daily. 09/14/20   Jaynee Eagles, PA-C  valACYclovir (VALTREX) 500 MG tablet Take 1 tablet (500 mg total) by mouth 2 (two) times daily. 05/19/20   Rubie Maid, MD    Allergies    Patient has no known allergies.  Review of Systems   Review of Systems  All other systems reviewed and are negative.  Physical Exam Updated Vital Signs BP 103/78   Pulse 74   Temp 98.1 F (36.7 C)   Resp 18   SpO2 100%   Physical Exam Vitals and nursing note reviewed.  Constitutional:      Appearance: She is well-developed.  HENT:     Head: Normocephalic and atraumatic.     Nose: Nose normal. No congestion or rhinorrhea.  Eyes:     Pupils: Pupils are  equal, round, and reactive to light.  Cardiovascular:     Rate and Rhythm: Normal rate and regular rhythm.  Pulmonary:     Effort: Pulmonary effort is normal. No respiratory distress.     Breath sounds: No stridor.  Abdominal:     General: Abdomen is flat. There is no distension.  Musculoskeletal:        General: Swelling, tenderness and deformity (Left wrist) present. Normal range of motion.     Cervical back: Normal range of motion.  Skin:    General: Skin is warm and dry.  Neurological:     General: No focal deficit present.     Mental Status: She is alert.    ED Results / Procedures / Treatments   Labs (all labs ordered are listed, but only abnormal results are displayed) Labs Reviewed - No data to display  EKG None  Radiology DG Wrist 2 Views Left  Result Date: 12/24/2020 CLINICAL DATA:  30 year old female with distal left radius fracture status post reduction. EXAM: LEFT WRIST - 2 VIEW COMPARISON:  Left hand and wrist series 12/23/2020. FINDINGS: Comminuted distal radius fracture with radiocarpal and DRU involvement. Improved fracture alignment, especially substantial reduction in dorsal angulation. Near anatomic alignment now on the PA view. Regional soft tissue swelling. Distal ulna appears intact and no new osseous abnormality is identified. IMPRESSION: Comminuted distal radius fracture with improved alignment post reduction. Electronically Signed   By: Genevie Ann M.D.   On: 12/24/2020 05:12   DG Wrist Complete Left  Result Date: 12/23/2020 CLINICAL DATA:  Status post fall. EXAM: LEFT WRIST - COMPLETE 3+ VIEW COMPARISON:  None. FINDINGS: Acute fracture deformity is seen involving the distal left radius. Mild dorsal angulation of the distal fracture site is seen. There is no evidence of dislocation. There is no evidence of arthropathy or other focal bone abnormality. Diffuse soft tissue swelling is noted. IMPRESSION: Acute fracture of the distal left radius. Electronically  Signed   By: Virgina Norfolk M.D.   On: 12/23/2020 23:02   DG Hand Complete Left  Result Date: 12/23/2020 CLINICAL DATA:  Status post fall. EXAM: LEFT HAND - COMPLETE 3+ VIEW COMPARISON:  None. FINDINGS: There is an acute fracture of the distal left radius with mild dorsal angulation of the distal fracture site. There is no evidence of dislocation. There is no evidence of  arthropathy or other focal bone abnormality. Diffuse soft tissue swelling is seen. IMPRESSION: Acute fracture of the distal left radius. Electronically Signed   By: Virgina Norfolk M.D.   On: 12/23/2020 23:03    Procedures .Ortho Injury Treatment  Date/Time: 12/24/2020 6:46 AM Performed by: Merrily Pew, MD Authorized by: Merrily Pew, MD   Consent:    Consent obtained:  Verbal   Consent given by:  Patient   Risks discussed:  Fracture, irreducible dislocation, recurrent dislocation and nerve damage   Alternatives discussed:  No treatment, alternative treatment, immobilization, referral and delayed treatmentInjury location: wrist Location details: left wrist Injury type: fracture Fracture type: distal radius Pre-procedure neurovascular assessment: neurovascularly intact Pre-procedure distal perfusion: normal Pre-procedure neurological function: normal Pre-procedure range of motion: normal Anesthesia: hematoma block  Anesthesia: Local anesthesia used: yes Local Anesthetic: lidocaine 2% without epinephrine Anesthetic total: 10 mL  Patient sedated: NoManipulation performed: yes Skin traction used: no Skeletal traction used: yes Reduction successful: yes X-ray confirmed reduction: yes Immobilization: splint Splint type: sugar tong Splint Applied by: ED Provider and Ortho Tech Supplies used: cotton padding and Ortho-Glass Post-procedure neurovascular assessment: post-procedure neurovascularly intact Post-procedure distal perfusion: normal Post-procedure neurological function: normal Post-procedure range  of motion: improved     Medications Ordered in ED Medications  oxyCODONE-acetaminophen (PERCOCET/ROXICET) 5-325 MG per tablet 1 tablet (1 tablet Oral Given 12/23/20 2229)  lidocaine (XYLOCAINE) 2 % (with pres) injection 400 mg (400 mg Infiltration Given 12/24/20 0340)  HYDROmorphone (DILAUDID) injection 1 mg (1 mg Intravenous Given 12/24/20 0328)  oxyCODONE-acetaminophen (PERCOCET/ROXICET) 5-325 MG per tablet 2 tablet (2 tablets Oral Given 12/24/20 0541)    ED Course  I have reviewed the triage vital signs and the nursing notes.  Pertinent labs & imaging results that were available during my care of the patient were reviewed by me and considered in my medical decision making (see chart for details).    MDM Rules/Calculators/A&P                         Left wrist is neurovascularly intact before and after reduction.  Patient was given a hematoma block, pain medicine and was reduced with finger traps.  Repeat x-ray significantly improved.  Will refer to hand for ultimate management.  Splinted at bedside with the tech.  Pain controlled.  Discharged on pain meds.  Final Clinical Impression(s) / ED Diagnoses Final diagnoses:  Closed fracture of distal end of left radius, unspecified fracture morphology, initial encounter    Rx / DC Orders ED Discharge Orders          Ordered    oxyCODONE-acetaminophen (PERCOCET) 5-325 MG tablet  Every 4 hours PRN        12/24/20 0540             Terran Klinke, Corene Cornea, MD 12/24/20 [phone]

## 2020-12-27 ENCOUNTER — Encounter: Payer: Self-pay | Admitting: Obstetrics and Gynecology

## 2020-12-27 ENCOUNTER — Ambulatory Visit (INDEPENDENT_AMBULATORY_CARE_PROVIDER_SITE_OTHER): Payer: BC Managed Care – PPO | Admitting: Obstetrics and Gynecology

## 2020-12-27 ENCOUNTER — Other Ambulatory Visit: Payer: Self-pay

## 2020-12-27 VITALS — BP 98/60 | HR 61 | Ht 60.0 in | Wt 117.2 lb

## 2020-12-27 DIAGNOSIS — N921 Excessive and frequent menstruation with irregular cycle: Secondary | ICD-10-CM | POA: Diagnosis not present

## 2020-12-27 DIAGNOSIS — Z01419 Encounter for gynecological examination (general) (routine) without abnormal findings: Secondary | ICD-10-CM | POA: Diagnosis not present

## 2020-12-27 DIAGNOSIS — E785 Hyperlipidemia, unspecified: Secondary | ICD-10-CM

## 2020-12-27 DIAGNOSIS — Z30011 Encounter for initial prescription of contraceptive pills: Secondary | ICD-10-CM | POA: Diagnosis not present

## 2020-12-27 DIAGNOSIS — Z3009 Encounter for other general counseling and advice on contraception: Secondary | ICD-10-CM

## 2020-12-27 MED ORDER — NORETHIN ACE-ETH ESTRAD-FE 1-20 MG-MCG PO TABS
1.0000 | ORAL_TABLET | Freq: Every day | ORAL | 3 refills | Status: DC
Start: 2020-12-27 — End: 2021-05-16

## 2020-12-27 NOTE — Patient Instructions (Signed)
Breast Self-Awareness Breast self-awareness means being familiar with how your breasts look and feel. It involves checking your breasts regularly and reporting any changes to your health care provider. Practicing breast self-awareness is important. Sometimes changes may not be harmful (are benign), but sometimes a change in your breasts can be a sign of a serious medical problem. It is important to learn how to do this procedure correctly so that you can catch problems early, when treatment is more likely to be successful. All women should practice breast self-awareness, including women who have had breast implants. What you need: A mirror. A well-lit room. How to do a breast self-exam A breast self-exam is one way to learn what is normal for your breasts and whether your breasts are changing. To do a breast self-exam: Look for changes  Remove all the clothing above your waist. Stand in front of a mirror in a room with good lighting. Put your hands on your hips. Push your hands firmly downward. Compare your breasts in the mirror. Look for differences between them (asymmetry), such as: Differences in shape. Differences in size. Puckers, dips, and bumps in one breast and not the other. Look at each breast for changes in the skin, such as: Redness. Scaly areas. Look for changes in your nipples, such as: Discharge. Bleeding. Dimpling. Redness. A change in position. Feel for changes Carefully feel your breasts for lumps and changes. It is best to do this while lying on your back on the floor, and again while sitting or standing in the tub or shower with soapy water on your skin. Feel each breast in the following way: Place the arm on the side of the breast you are examining above your head. Feel your breast with the other hand. Start in the nipple area and make -inch (2 cm) overlapping circles to feel your breast. Use the pads of your three middle fingers to do this. Apply light pressure,  then medium pressure, then firm pressure. The light pressure will allow you to feel the tissue closest to the skin. The medium pressure will allow you to feel the tissue that is a little deeper. The firm pressure will allow you to feel the tissue close to the ribs. Continue the overlapping circles, moving downward over the breast until you feel your ribs below your breast. Move one finger-width toward the center of the body. Continue to use the -inch (2 cm) overlapping circles to feel your breast as you move slowly up toward your collarbone. Continue the up-and-down exam using all three pressures until you reach your armpit.  Write down what you find Writing down what you find can help you remember what to discuss with your health care provider. Write down: What is normal for each breast. Any changes that you find in each breast, including: The kind of changes you find. Any pain or tenderness. Size and location of any lumps. Where you are in your menstrual cycle, if you are still menstruating. General tips and recommendations Examine your breasts every month. If you are breastfeeding, the best time to examine your breasts is after a feeding or after using a breast pump. If you menstruate, the best time to examine your breasts is 5-7 days after your period. Breasts are generally lumpier during menstrual periods, and it may be more difficult to notice changes. With time and practice, you will become more familiar with the variations in your breasts and more comfortable with the exam. Contact a health care provider if you:  See a change in the shape or size of your breasts or nipples. See a change in the skin of your breast or nipples, such as a reddened or scaly area. Have unusual discharge from your nipples. Find a lump or thick area that was not there before. Have pain in your breasts. Have any concerns related to your breast health. Summary Breast self-awareness includes looking for  physical changes in your breasts, as well as feeling for any changes within your breasts. Breast self-awareness should be performed in front of a mirror in a well-lit room. You should examine your breasts every month. If you menstruate, the best time to examine your breasts is 5-7 days after your menstrual period. Let your health care provider know of any changes you notice in your breasts, including changes in size, changes on the skin, pain or tenderness, or unusual fluid from your nipples. This information is not intended to replace advice given to you by your health care provider. Make sure you discuss any questions you have with your health care provider. Document Revised: 09/10/2017 Document Reviewed: 09/10/2017 Elsevier Patient Education  [place] 95-58 Years Old, Female Preventive care refers to lifestyle choices and visits with your health care provider that can promote health and wellness. Preventive care visits are also called wellness exams. What can I expect for my preventive care visit? Counseling During your preventive care visit, your health care provider may ask about your: Medical history, including: Past medical problems. Family medical history. Pregnancy history. Current health, including: Menstrual cycle. Method of birth control. Emotional well-being. Home life and relationship well-being. Sexual activity and sexual health. Lifestyle, including: Alcohol, nicotine or tobacco, and drug use. Access to firearms. Diet, exercise, and sleep habits. Work and work Statistician. Sunscreen use. Safety issues such as seatbelt and bike helmet use. Physical exam Your health care provider may check your: Height and weight. These may be used to calculate your BMI (body mass index). BMI is a measurement that tells if you are at a healthy weight. Waist circumference. This measures the distance around your waistline. This measurement also tells if you are  at a healthy weight and may help predict your risk of certain diseases, such as type 2 diabetes and high blood pressure. Heart rate and blood pressure. Body temperature. Skin for abnormal spots. What immunizations do I need? Vaccines are usually given at various ages, according to a schedule. Your health care provider will recommend vaccines for you based on your age, medical history, and lifestyle or other factors, such as travel or where you work. What tests do I need? Screening Your health care provider may recommend screening tests for certain conditions. This may include: Pelvic exam and Pap test. Lipid and cholesterol levels. Diabetes screening. This is done by checking your blood sugar (glucose) after you have not eaten for a while (fasting). Hepatitis B test. Hepatitis C test. HIV (human immunodeficiency virus) test. STI (sexually transmitted infection) testing, if you are at risk. BRCA-related cancer screening. This may be done if you have a family history of breast, ovarian, tubal, or peritoneal cancers. Talk with your health care provider about your test results, treatment options, and if necessary, the need for more tests. Follow these instructions at home: Eating and drinking  Eat a healthy diet that includes fresh fruits and vegetables, whole grains, lean protein, and low-fat dairy products. Take vitamin and mineral supplements as recommended by your health care provider. Do not drink alcohol if: Your health care  provider tells you not to drink. You are pregnant, may be pregnant, or are planning to become pregnant. If you drink alcohol: Limit how much you have to 0-1 drink a day. Know how much alcohol is in your drink. In the [place] one drink equals one 12 oz bottle of beer (355 mL), one 5 oz glass of wine (148 mL), or one 1 oz glass of hard liquor (44 mL). Lifestyle Brush your teeth every morning and night with fluoride toothpaste. Floss one time each day. Exercise for  at least 30 minutes 5 or more days each week. Do not use any products that contain nicotine or tobacco. These products include cigarettes, chewing tobacco, and vaping devices, such as e-cigarettes. If you need help quitting, ask your health care provider. Do not use drugs. If you are sexually active, practice safe sex. Use a condom or other form of protection to prevent STIs. If you do not wish to become pregnant, use a form of birth control. If you plan to become pregnant, see your health care provider for a prepregnancy visit. Find healthy ways to manage stress, such as: Meditation, yoga, or listening to music. Journaling. Talking to a trusted person. Spending time with friends and family. Minimize exposure to UV radiation to reduce your risk of skin cancer. Safety Always wear your seat belt while driving or riding in a vehicle. Do not drive: If you have been drinking alcohol. Do not ride with someone who has been drinking. If you have been using any mind-altering substances or drugs. While texting. When you are tired or distracted. Wear a helmet and other protective equipment during sports activities. If you have firearms in your house, make sure you follow all gun safety procedures. Seek help if you have been physically or sexually abused. What's next? Go to your health care provider once a year for an annual wellness visit. Ask your health care provider how often you should have your eyes and teeth checked. Stay up to date on all vaccines. This information is not intended to replace advice given to you by your health care provider. Make sure you discuss any questions you have with your health care provider. Document Revised: 07/20/2020 Document Reviewed: 07/20/2020 Elsevier Patient Education  [place].

## 2020-12-27 NOTE — Progress Notes (Signed)
GYNECOLOGY ANNUAL PHYSICAL EXAM PROGRESS NOTE  Subjective:    Darlene Miller is a 30 y.o. [id number] female who presents for an annual exam. The patient has no complaints today. The patient is sexually active.  The patient wears seatbelts: yes. The patient participates in regular exercise: no. Has the patient ever been transfused or tattooed?: no. The patient reports that there is not domestic violence in her life.   Patient of note reports that she fractured her wrist last weekend while skating.  Currently has arm in a cast.  Notes recently experiencing a BV infection (diagnosed at a walk-in clinic in [place]).  Reports being treated.   Menstrual History: Menarche age: 76 or 57 No LMP recorded. Patient has had an injection.  Does have random intermittent episodes of heavy bleeding, can last for 2-3 days. Last episode was ~ 2 months ago.    Gynecologic History:  Contraception: Depo-Provera injections (has been on it for ~ 4 years).  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 Results were: normal.   Reports h/o abnormal pap smear in 2017 (LGSIL, followed by colposcopy, normal biopsy).  Last mammogram: Not age appropriate.    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    Past Surgical History:  Procedure Laterality Date   CESAREAN SECTION N/A 06/08/2016   Procedure: CESAREAN SECTION;  Surgeon: Harlin Heys, MD;  Location: [facility] ORS;  Service: Obstetrics;  Laterality: N/A;  Female born @ [birth year] Weight: 5lbs  Apgars: 8/9   NECK SURGERY     mass on neck- benign    Family  History  Problem Relation Age of Onset   Hypertension Mother    Restless legs syndrome Mother    COPD Mother    Cancer Neg Hx    Diabetes Neg Hx    Heart disease Neg Hx     Social History   Socioeconomic History   Marital status: Single    Spouse name: Not on file   Number of children: Not on file   Years of education: Not on file   Highest education level: Not on file  Occupational History   Not on file  Tobacco Use   Smoking status: Former    Packs/day: 0.25    Types: Cigarettes    Quit date: 08/06/2015    Years since quitting: 5.3   Smokeless tobacco: Never  Vaping Use   Vaping Use: Some days   Substances: Nicotine, Flavoring  Substance and Sexual Activity   Alcohol use: Yes    Comment: occas   Drug use: No   Sexual activity: Yes    Birth control/protection: Injection  Other Topics Concern   Not on file  Social History Narrative   Not on file   Social Determinants of Health   Financial Resource Strain: Not on file  Food Insecurity: Not on file  Transportation Needs: Not on file  Physical Activity: Not on file  Stress: Not on file  Social Connections: Not on file  Intimate Partner Violence: Not on file    Current Outpatient Medications on File Prior to Visit  Medication Sig Dispense Refill   cetirizine (ZYRTEC ALLERGY) 10 MG tablet Take 1 tablet (10 mg total) by mouth daily. 30 tablet 0   famotidine (PEPCID) 40 MG tablet Take 1 tablet (40 mg total) by mouth at bedtime. 30 tablet 0   hydrOXYzine (ATARAX/VISTARIL) 25 MG tablet Take 0.5-1 tablets (12.5-25 mg total) by mouth every 8 (eight) hours as needed for itching. 30 tablet 0   medroxyPROGESTERone Acetate 150 MG/ML SUSY INJECT 1 ML INTO THE MUSCLE ONCE EVERY 3 MONTHS 1 mL 3   metroNIDAZOLE (FLAGYL) 500 MG tablet Take 1 tablet (500 mg total) by mouth 2 (two) times daily. 14 tablet 0   oxyCODONE-acetaminophen (PERCOCET) 5-325 MG tablet Take 2 tablets by mouth every 4 (four) hours as needed. 30 tablet 0    predniSONE (DELTASONE) 20 MG tablet Take 2 tablets daily with breakfast. 10 tablet 0   triamcinolone cream (KENALOG) 0.1 % Apply 1 application topically 2 (two) times daily. 100 g 0   valACYclovir (VALTREX) 500 MG tablet Take 1 tablet (500 mg total) by mouth 2 (two) times daily. 30 tablet 6   No current facility-administered medications on file prior to visit.    No Known Allergies   Review of Systems Constitutional: negative for chills, fatigue, fevers and sweats Eyes: negative for irritation, redness and visual disturbance Ears, nose, mouth, throat, and face: negative for hearing loss, nasal congestion, snoring and tinnitus Respiratory: negative for asthma, cough, sputum Cardiovascular: negative for chest pain, dyspnea, exertional chest pressure/discomfort, irregular heart beat, palpitations and syncope Gastrointestinal: negative for abdominal pain, change in bowel habits, nausea and vomiting Genitourinary: positive for abnormal menstrual periods, genital lesions, sexual problems and vaginal discharge, dysuria and urinary incontinence Integument/breast: negative for breast lump, breast tenderness and nipple discharge Hematologic/lymphatic: negative for bleeding and easy bruising Musculoskeletal:negative for back pain and muscle weakness Neurological: negative for dizziness, headaches, vertigo and weakness Endocrine: negative for diabetic symptoms including polydipsia, polyuria and skin dryness Allergic/Immunologic: negative for hay fever and urticaria     Objective:  Blood pressure 98/60, pulse 61, height 5' (1.524 m), weight 117 lb 3.2 oz (53.2 kg).  Body mass index is 22.89 kg/m.  General Appearance:    Alert, cooperative, no distress, appears stated age  Head:    Normocephalic, without obvious abnormality, atraumatic  Eyes:    PERRL, conjunctiva/corneas clear, EOM's intact, both eyes  Ears:    Normal external ear canals, both ears  Nose:   Nares normal, septum midline, mucosa  normal, no drainage or sinus tenderness  Throat:   Lips, mucosa, and tongue normal; teeth and gums normal  Neck:   Supple, symmetrical, trachea midline, no adenopathy; thyroid: no enlargement/tenderness/nodules; no carotid bruit or JVD  Back:     Symmetric, no curvature, ROM normal, no CVA tenderness  Lungs:     Clear to auscultation bilaterally, respirations unlabored  Chest Wall:    No tenderness or deformity   Heart:    Regular rate and rhythm, S1 and S2 normal, no murmur, rub or gallop  Breast Exam:    No tenderness, masses, or nipple abnormality  Abdomen:     Soft, non-tender, bowel sounds active all four quadrants, no masses, no organomegaly.    Genitalia:    Pelvic:external genitalia normal, vagina without lesions, discharge, or tenderness, rectovaginal septum  normal. Cervix  normal in appearance, no cervical motion tenderness, no adnexal masses or tenderness.  Uterus normal size, shape, mobile, regular contours, nontender.  Rectal:    Normal external sphincter.  No hemorrhoids appreciated. Internal exam not done.   Extremities:   Extremities normal, no cyanosis or edema. Left arm in cast.   Pulses:   2+ and symmetric all extremities  Skin:   Skin color, texture, turgor normal, no rashes or lesions  Lymph nodes:   Cervical, supraclavicular, and axillary nodes normal  Neurologic:   CNII-XII intact, normal strength, sensation and reflexes throughout   .  Labs:  Lab Results  Component Value Date   WBC 7.3 12/19/2018   HGB 14.4 12/19/2018   HCT 42.0 12/19/2018   MCV 90 12/19/2018   PLT 258 12/19/2018    Lab Results  Component Value Date   CREATININE 0.77 12/19/2018   BUN 18 12/19/2018   NA 140 12/19/2018   K 4.2 12/19/2018   CL 102 12/19/2018   CO2 22 12/19/2018    Lab Results  Component Value Date   ALT 19 12/19/2018   AST 21 12/19/2018   ALKPHOS 83 12/19/2018   BILITOT <0.2 12/19/2018    Lab Results  Component Value Date   TSH 1.400 12/19/2018    Assessment:    1. Well woman exam with routine gynecological exam   2. Encounter for counseling regarding contraception   3. Oral contraception initial prescription   4. Breakthrough bleeding on depo provera   5. Dyslipidemia     Plan:  - Blood tests: CBC, CMP, Lipid panel. Will follow up at nearest [place] for fasting labs. - Breast self exam technique reviewed and patient encouraged to perform self-exam monthly. - Contraception: OCP (estrogen/progesterone).  Counseled on prolonged use of Depo Provera. Patient notes she will take a break from the Depo and will try OCPs for at least 6 months.  - Discussed healthy lifestyle modifications. - Mammogram: to begin screenings at age 46. - Pap smear  UTD   .Continue q 3 year pap smears.  Due in 2 years.  - COVID vaccination status: Declines - Flu vaccine: up to date, received in October.   Follow up in 1 year for annual exam   Rubie Maid, MD [facility]

## 2020-12-28 DIAGNOSIS — S52502A Unspecified fracture of the lower end of left radius, initial encounter for closed fracture: Secondary | ICD-10-CM | POA: Diagnosis not present

## 2021-01-04 DIAGNOSIS — S52502A Unspecified fracture of the lower end of left radius, initial encounter for closed fracture: Secondary | ICD-10-CM | POA: Diagnosis not present

## 2021-01-04 DIAGNOSIS — M25532 Pain in left wrist: Secondary | ICD-10-CM | POA: Diagnosis not present

## 2021-01-20 DIAGNOSIS — S52502A Unspecified fracture of the lower end of left radius, initial encounter for closed fracture: Secondary | ICD-10-CM | POA: Diagnosis not present

## 2021-02-01 DIAGNOSIS — S52502A Unspecified fracture of the lower end of left radius, initial encounter for closed fracture: Secondary | ICD-10-CM | POA: Diagnosis not present

## 2021-02-01 NOTE — H&P (Signed)
Preoperative History & Physical Exam  Surgeon: Philipp Ovens, MD  Diagnosis: Left wrist fracture  Planned Procedure: Procedure(s) (LRB): OPEN REDUCTION INTERNAL FIXATION (ORIF) WRIST FRACTURE (Left)  History of Present Illness:   Patient is a 30 y.o. female with symptoms consistent with Left wrist fracture who presents for surgical intervention. The risks, benefits and alternatives of surgical intervention were discussed and informed consent was obtained prior to surgery.  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 Authorizing Provider  cetirizine (ZYRTEC ALLERGY) 10 MG tablet Take 1 tablet (10 mg total) by mouth daily. 09/02/20   Raspet, Noberto Retort, PA-C  norethindrone-ethinyl estradiol-FE (JUNEL FE 1/20) 1-20 MG-MCG tablet Take 1 tablet by mouth daily. 12/27/20   Hildred Laser, MD  oxyCODONE-acetaminophen (PERCOCET) 5-325 MG tablet Take 2 tablets by mouth every 4 (four) hours as needed. 12/24/20   Mesner, Barbara Cower, MD    Allergies:  Patient has no known allergies.  Review of Systems: Negative except per HPI.  Physical Exam: Alert and oriented, NAD Head and neck: no masses, normal alignment CV: pulse intact Pulm: no increased work of breathing, respirations even and unlabored Abdomen: non-distended Extremities: extremities warm and well perfused  LABS: Recent Results (from the past 2160 hour(s))  Cervicovaginal ancillary only     Status: Abnormal   Collection Time:  12/06/20  8:48 AM  Result Value Ref Range   Neisseria Gonorrhea Negative    Chlamydia Negative    Trichomonas Negative    Bacterial Vaginitis (gardnerella) Positive (A)    Candida Vaginitis Negative    Candida Glabrata Negative    Comment      Normal Reference Range Bacterial Vaginosis - Negative   Comment Normal Reference Range Candida Species - Negative    Comment Normal Reference Range Candida Galbrata - Negative    Comment Normal Reference Range Trichomonas - Negative    Comment Normal Reference Ranger Chlamydia - Negative    Comment      Normal Reference Range Neisseria Gonorrhea - Negative  HIV Antibody (routine testing w rflx)     Status: None   Collection Time: 12/06/20  8:48 AM  Result Value Ref Range   HIV Screen 4th Generation wRfx Non Reactive Non Reactive    Comment: HIV Negative HIV-1/HIV-2 antibodies and HIV-1 p24 antigen were NOT detected. There is no laboratory evidence of HIV infection.   RPR     Status: None   Collection Time: 12/06/20  8:48 AM  Result Value Ref Range   RPR Ser Ql Non Reactive Non Reactive     Complete History and Physical exam available in the office notes  Gomez Cleverly

## 2021-02-02 ENCOUNTER — Other Ambulatory Visit: Payer: Self-pay

## 2021-02-02 ENCOUNTER — Encounter (HOSPITAL_COMMUNITY): Payer: Self-pay | Admitting: Orthopedic Surgery

## 2021-02-02 NOTE — Progress Notes (Signed)
Notified pt that we told her the wrong time of arrival for tomorrow. I explained to her that her surgery is probably going to be at 7 PM tomorrow and we ask that she comes in at 4:30 PM ( just in case Dr. Yehuda Budd finishes early and can take her sooner than 7 PM). I did tell her to call the OR main number [phone]) and ask them what is her start time of surgery. She voiced understanding.

## 2021-02-02 NOTE — Progress Notes (Addendum)
Ms Beckmann denies chest pain or shortness of breath. Patient denies having any s/s of Covid in her household.  Patient denies any known exposure to Covid.   I instructed patient to shower with antibiotic soap, if it is available.  Dry off with a clean towel. Do not put lotion, powder, cologne or deodorant or makeup.No jewelry or piercings. Men may shave their face and neck. Woman should not shave. No nail polish, artificial or acrylic nails. Wear clean clothes, brush your teeth. Glasses, contact lens,dentures or partials may not be worn in the OR. If you need to wear them, please bring a case for glasses, do not wear contacts or bring a case, the hospital does not have contact cases, dentures or partials will have to be removed , make sure they are clean, we will provide a denture cup to put them in. You will need some one to drive you home and a responsible person over the age of 66 to stay with you for the first 24 hours after surgery.   Ms Sabbagh does not have a PCP.

## 2021-02-03 ENCOUNTER — Encounter (HOSPITAL_COMMUNITY)
Admission: RE | Disposition: A | Discharge status: Home / Self Care | Payer: Self-pay | Source: Ambulatory Visit | Attending: Orthopedic Surgery

## 2021-02-03 ENCOUNTER — Ambulatory Visit (HOSPITAL_COMMUNITY): Payer: BC Managed Care – PPO | Admitting: Anesthesiology

## 2021-02-03 ENCOUNTER — Encounter (HOSPITAL_COMMUNITY): Payer: Self-pay | Admitting: Orthopedic Surgery

## 2021-02-03 ENCOUNTER — Other Ambulatory Visit: Payer: Self-pay

## 2021-02-03 ENCOUNTER — Ambulatory Visit (HOSPITAL_COMMUNITY)
Admission: RE | Admit: 2021-02-03 | Discharge: 2021-02-03 | Disposition: A | Discharge status: Home / Self Care | Payer: BC Managed Care – PPO | Source: Ambulatory Visit | Attending: Orthopedic Surgery | Admitting: Orthopedic Surgery

## 2021-02-03 ENCOUNTER — Ambulatory Visit (HOSPITAL_COMMUNITY): Payer: BC Managed Care – PPO

## 2021-02-03 DIAGNOSIS — Z87891 Personal history of nicotine dependence: Secondary | ICD-10-CM | POA: Diagnosis not present

## 2021-02-03 DIAGNOSIS — X58XXXA Exposure to other specified factors, initial encounter: Secondary | ICD-10-CM | POA: Diagnosis not present

## 2021-02-03 DIAGNOSIS — S62102A Fracture of unspecified carpal bone, left wrist, initial encounter for closed fracture: Secondary | ICD-10-CM

## 2021-02-03 DIAGNOSIS — S52572A Other intraarticular fracture of lower end of left radius, initial encounter for closed fracture: Secondary | ICD-10-CM | POA: Insufficient documentation

## 2021-02-03 HISTORY — PX: ORIF WRIST FRACTURE: SHX2133

## 2021-02-03 LAB — CBC
HCT: 47 % — ABNORMAL HIGH (ref 36.0–46.0)
Hemoglobin: 15.4 g/dL — ABNORMAL HIGH (ref 12.0–15.0)
MCH: 30.3 pg (ref 26.0–34.0)
MCHC: 32.8 g/dL (ref 30.0–36.0)
MCV: 92.5 fL (ref 80.0–100.0)
Platelets: 293 10*3/uL (ref 150–400)
RBC: 5.08 MIL/uL (ref 3.87–5.11)
RDW: 12.4 % (ref 11.5–15.5)
WBC: 7.2 10*3/uL (ref 4.0–10.5)
nRBC: 0 % (ref 0.0–0.2)

## 2021-02-03 LAB — POCT PREGNANCY, URINE: Preg Test, Ur: NEGATIVE

## 2021-02-03 SURGERY — OPEN REDUCTION INTERNAL FIXATION (ORIF) WRIST FRACTURE
Anesthesia: General | Site: Wrist | Laterality: Left

## 2021-02-03 MED ORDER — ORAL CARE MOUTH RINSE: 15.0000 mL | Freq: Once | OROMUCOSAL | Status: AC

## 2021-02-03 MED ORDER — AMISULPRIDE (ANTIEMETIC) 5 MG/2ML IV SOLN
10.0000 mg | Freq: Once | INTRAVENOUS | Status: AC
  Administered 2021-02-03: 22:00:00 10 mg via INTRAVENOUS

## 2021-02-03 MED ORDER — ONDANSETRON HCL 4 MG/2ML IJ SOLN
INTRAMUSCULAR | Status: DC | PRN
  Administered 2021-02-03: 4 mg via INTRAVENOUS

## 2021-02-03 MED ORDER — BUPIVACAINE HCL (PF) 0.25 % IJ SOLN
INTRAMUSCULAR | Status: DC | PRN
  Administered 2021-02-03: 20 mL

## 2021-02-03 MED ORDER — LACTATED RINGERS IV SOLN: INTRAVENOUS | Status: DC

## 2021-02-03 MED ORDER — FENTANYL CITRATE (PF) 250 MCG/5ML IJ SOLN
INTRAMUSCULAR | Status: AC
  Filled 2021-02-03: qty 5

## 2021-02-03 MED ORDER — CEFAZOLIN SODIUM-DEXTROSE 2-4 GM/100ML-% IV SOLN
2.0000 g | INTRAVENOUS | Status: AC
  Administered 2021-02-03: 20:00:00 2 g via INTRAVENOUS

## 2021-02-03 MED ORDER — BUPIVACAINE HCL (PF) 0.25 % IJ SOLN
INTRAMUSCULAR | Status: AC
  Filled 2021-02-03: qty 30

## 2021-02-03 MED ORDER — CHLORHEXIDINE GLUCONATE 0.12 % MT SOLN: 15.0000 mL | Freq: Once | OROMUCOSAL | Status: AC

## 2021-02-03 MED ORDER — CEFAZOLIN SODIUM-DEXTROSE 2-4 GM/100ML-% IV SOLN
INTRAVENOUS | Status: AC
  Filled 2021-02-03: qty 100

## 2021-02-03 MED ORDER — OXYCODONE-ACETAMINOPHEN 5-325 MG PO TABS: 1.0000 | ORAL_TABLET | Freq: Four times a day (QID) | ORAL | 0 refills | Status: AC | PRN

## 2021-02-03 MED ORDER — MIDAZOLAM HCL 2 MG/2ML IJ SOLN
INTRAMUSCULAR | Status: DC | PRN
  Administered 2021-02-03: 2 mg via INTRAVENOUS

## 2021-02-03 MED ORDER — DEXAMETHASONE SODIUM PHOSPHATE 10 MG/ML IJ SOLN
INTRAMUSCULAR | Status: DC | PRN
  Administered 2021-02-03: 10 mg via INTRAVENOUS

## 2021-02-03 MED ORDER — FENTANYL CITRATE (PF) 250 MCG/5ML IJ SOLN
INTRAMUSCULAR | Status: DC | PRN
  Administered 2021-02-03 (×2): 25 ug via INTRAVENOUS
  Administered 2021-02-03 (×2): 50 ug via INTRAVENOUS
  Administered 2021-02-03 (×4): 25 ug via INTRAVENOUS

## 2021-02-03 MED ORDER — PROPOFOL 10 MG/ML IV BOLUS
INTRAVENOUS | Status: DC | PRN
  Administered 2021-02-03: 160 mg via INTRAVENOUS

## 2021-02-03 MED ORDER — LIDOCAINE 2% (20 MG/ML) 5 ML SYRINGE
INTRAMUSCULAR | Status: DC | PRN
  Administered 2021-02-03: 60 mg via INTRAVENOUS

## 2021-02-03 MED ORDER — PROPOFOL 10 MG/ML IV BOLUS
INTRAVENOUS | Status: AC
  Filled 2021-02-03: qty 20

## 2021-02-03 MED ORDER — HYDROMORPHONE HCL 1 MG/ML IJ SOLN
INTRAMUSCULAR | Status: AC
  Filled 2021-02-03: qty 1

## 2021-02-03 MED ORDER — MIDAZOLAM HCL 2 MG/2ML IJ SOLN
INTRAMUSCULAR | Status: AC
  Filled 2021-02-03: qty 2

## 2021-02-03 MED ORDER — 0.9 % SODIUM CHLORIDE (POUR BTL) OPTIME
TOPICAL | Status: DC | PRN
  Administered 2021-02-03: 21:00:00 1000 mL

## 2021-02-03 MED ORDER — HYDROMORPHONE HCL 1 MG/ML IJ SOLN
0.2500 mg | INTRAMUSCULAR | Status: DC | PRN
  Administered 2021-02-03 (×2): 0.5 mg via INTRAVENOUS

## 2021-02-03 MED ORDER — CHLORHEXIDINE GLUCONATE 0.12 % MT SOLN
OROMUCOSAL | Status: AC
  Administered 2021-02-03: 17:00:00 15 mL via OROMUCOSAL
  Filled 2021-02-03: qty 15

## 2021-02-03 SURGICAL SUPPLY — 63 items
BAG COUNTER SPONGE SURGICOUNT (BAG) ×1 IMPLANT
BIT DRILL 2.2 SS TIBIAL (BIT) ×1 IMPLANT
BLADE CLIPPER SURG (BLADE) IMPLANT
BNDG ELASTIC 3X5.8 VLCR STR LF (GAUZE/BANDAGES/DRESSINGS) ×1 IMPLANT
BNDG ELASTIC 4X5.8 VLCR STR LF (GAUZE/BANDAGES/DRESSINGS) ×2 IMPLANT
BNDG ESMARK 4X9 LF (GAUZE/BANDAGES/DRESSINGS) ×2 IMPLANT
BNDG GAUZE ELAST 4 BULKY (GAUZE/BANDAGES/DRESSINGS) ×2 IMPLANT
CANISTER SUCT 3000ML PPV (MISCELLANEOUS) ×2 IMPLANT
CORD BIPOLAR FORCEPS 12FT (ELECTRODE) ×2 IMPLANT
COVER SURGICAL LIGHT HANDLE (MISCELLANEOUS) ×2 IMPLANT
CUFF TOURN SGL QUICK 18X4 (TOURNIQUET CUFF) ×2 IMPLANT
CUFF TOURN SGL QUICK 24 (TOURNIQUET CUFF)
CUFF TRNQT CYL 24X4X16.5-23 (TOURNIQUET CUFF) IMPLANT
DRAIN TLS ROUND 10FR (DRAIN) IMPLANT
DRAPE OEC MINIVIEW 54X84 (DRAPES) ×2 IMPLANT
DRAPE SURG 17X23 STRL (DRAPES) ×2 IMPLANT
GAUZE SPONGE 4X4 12PLY STRL (GAUZE/BANDAGES/DRESSINGS) ×2 IMPLANT
GAUZE XEROFORM 1X8 LF (GAUZE/BANDAGES/DRESSINGS) ×2 IMPLANT
GLOVE SURG MICRO LTX SZ8 (GLOVE) ×2 IMPLANT
GOWN STRL REUS W/ TWL LRG LVL3 (GOWN DISPOSABLE) ×1 IMPLANT
GOWN STRL REUS W/ TWL XL LVL3 (GOWN DISPOSABLE) ×1 IMPLANT
GOWN STRL REUS W/TWL LRG LVL3 (GOWN DISPOSABLE) ×1
GOWN STRL REUS W/TWL XL LVL3 (GOWN DISPOSABLE) ×1
K-WIRE 1.6 (WIRE) ×3
K-WIRE FX5X1.6XNS BN SS (WIRE) ×3
KIT BASIN OR (CUSTOM PROCEDURE TRAY) ×2 IMPLANT
KIT TURNOVER KIT B (KITS) ×2 IMPLANT
KWIRE FX5X1.6XNS BN SS (WIRE) IMPLANT
LOOP VESSEL MAXI BLUE (MISCELLANEOUS) IMPLANT
MANIFOLD NEPTUNE II (INSTRUMENTS) ×2 IMPLANT
NEEDLE 22X1 1/2 (OR ONLY) (NEEDLE) ×1 IMPLANT
NS IRRIG 1000ML POUR BTL (IV SOLUTION) ×2 IMPLANT
PACK ORTHO EXTREMITY (CUSTOM PROCEDURE TRAY) ×2 IMPLANT
PAD ARMBOARD 7.5X6 YLW CONV (MISCELLANEOUS) ×4 IMPLANT
PAD CAST 3X4 CTTN HI CHSV (CAST SUPPLIES) ×1 IMPLANT
PAD CAST 4YDX4 CTTN HI CHSV (CAST SUPPLIES) ×1 IMPLANT
PADDING CAST ABS 3INX4YD NS (CAST SUPPLIES) ×1
PADDING CAST ABS COTTON 3X4 (CAST SUPPLIES) IMPLANT
PADDING CAST COTTON 3X4 STRL (CAST SUPPLIES) ×1
PADDING CAST COTTON 4X4 STRL (CAST SUPPLIES) ×1
PEG LOCKING SMOOTH 2.2X16 (Screw) ×1 IMPLANT
PEG LOCKING SMOOTH 2.2X18 (Peg) ×4 IMPLANT
PILLOW ARM CARTER ADULT (MISCELLANEOUS) ×1 IMPLANT
PLATE BN MN NAR 41X22 CRSLCK (Plate) IMPLANT
PLATE CROSSLOCK NAR MINI LT (Plate) ×1 IMPLANT
SCREW LOCK 14X2.7X 3 LD TPR (Screw) IMPLANT
SCREW LOCKING 2.7X14 (Screw) ×4 IMPLANT
SCREW LOCKING 2.7X15MM (Screw) ×1 IMPLANT
SCREW NONLOCK 2.7X20MM (Screw) ×1 IMPLANT
SLING ARM FOAM STRAP MED (SOFTGOODS) ×1 IMPLANT
SOL PREP POV-IOD 4OZ 10% (MISCELLANEOUS) ×4 IMPLANT
SPONGE T-LAP 4X18 ~~LOC~~+RFID (SPONGE) ×1 IMPLANT
SUT MNCRL AB 4-0 PS2 18 (SUTURE) ×1 IMPLANT
SUT PROLENE 3 0 PS 2 (SUTURE) IMPLANT
SUT VIC AB 3-0 FS2 27 (SUTURE) IMPLANT
SYR CONTROL 10ML LL (SYRINGE) ×1 IMPLANT
SYSTEM CHEST DRAIN TLS 7FR (DRAIN) IMPLANT
TOWEL GREEN STERILE (TOWEL DISPOSABLE) ×2 IMPLANT
TOWEL GREEN STERILE FF (TOWEL DISPOSABLE) ×2 IMPLANT
TUBE CONNECTING 12X1/4 (SUCTIONS) ×2 IMPLANT
TUBE EVACUATION TLS (MISCELLANEOUS) ×2 IMPLANT
UNDERPAD 30X36 HEAVY ABSORB (UNDERPADS AND DIAPERS) ×2 IMPLANT
WATER STERILE IRR 1000ML POUR (IV SOLUTION) ×2 IMPLANT

## 2021-02-03 NOTE — Transfer of Care (Signed)
Immediate Anesthesia Transfer of Care Note  Patient: Darlene Miller  Procedure(s) Performed: OPEN REDUCTION INTERNAL FIXATION (ORIF) WRIST FRACTURE (Left: Wrist)  Patient Location: PACU  Anesthesia Type:General  Level of Consciousness: awake, alert  and oriented  Airway & Oxygen Therapy: Patient Spontanous Breathing and Patient connected to nasal cannula oxygen  Post-op Assessment: Report given to RN, Post -op Vital signs reviewed and stable and Patient moving all extremities  Post vital signs: Reviewed and stable  Last Vitals:  Vitals Value Taken Time  BP 129/89 02/03/21 2148  Temp 36.6 C 02/03/21 2150  Pulse 94 02/03/21 2153  Resp 13 02/03/21 2153  SpO2 98 % 02/03/21 2153  Vitals shown include unvalidated device data.  Last Pain:  Vitals:   02/03/21 2150  TempSrc:   PainSc: 0-No pain         Complications: No notable events documented.

## 2021-02-03 NOTE — Discharge Instructions (Addendum)
°  Orthopaedic Hand Surgery Discharge Instructions  WEIGHT BEARING STATUS: Non weight bearing on operative extremity  DRESSINGS: Please keep your dressing/splint/cast clean and dry until your follow-up appointment. You may shower by placing a waterproof covering over your dressing/splint/cast. Contact your surgeon if your splint/cast gets wet. It will need to be changed to prevent skin breakdown.  PAIN CONTROL: First line medications for post operative pain control are Tylenol (acetaminophen) and Motrin (ibuprofen) if you are able to take these medications. If you have been prescribed a medication these can be taken as breakthrough pain medications. Please note that some narcotic pain medication have acetaminophen added and you should never consume more than 4,000mg  of acetaminophen in 24 hour period. Also please note that if you are given Toradol (ketoralac) you should not take similar medications simultaneously such as ibuprofen.   ICE/ELEVATION: Ice and elevate your injured extremity as needed. Avoid direct contact of ice with skin.  HOME MEDICATIONS: No changes have been made to your home medications.  FOLLOW UP: You will be called after surgery with an appointment date and time, however if you have not received a phone call within 3 days please call during regular office hours at [phone] to schedule a post operative appointment.  Please Seek Medical Attention if: Call MD for: pain or pressure in chest, jaw, arm, back, neck  Call MD for: temperature greater than 101 F for more than 24 hours  Call MD for: difficulty breathing Call MD for: Incision redness, bleeding, drainage  Call MD for: palpitations or feeling that the heart is racing  Call MD for: increased swelling in arm, leg, ankle, or abdomen  Call MD for: lightheadedness, dizziness, fainting Go to ED or call 911 if: chest pain does not go away after 3 nitroglycerin doses taken 5 min apart  Go to ED or call 911 for: any  uncontrolled bleeding  Go to ED or call 911 if: unable to reach physician  Discharge Medications: Allergies as of 02/03/2021   No Known Allergies      Medication List     TAKE these medications    calcium carbonate 500 MG chewable tablet Commonly known as: TUMS - dosed in mg elemental calcium Chew 2 tablets by mouth daily as needed for indigestion or heartburn.   cetirizine 10 MG tablet Commonly known as: ZyrTEC Allergy Take 1 tablet (10 mg total) by mouth daily. What changed:  when to take this reasons to take this   ibuprofen 800 MG tablet Commonly known as: ADVIL Take 800 mg by mouth every 8 (eight) hours as needed for moderate pain.   norethindrone-ethinyl estradiol-FE 1-20 MG-MCG tablet Commonly known as: Junel FE 1/20 Take 1 tablet by mouth daily.   oxyCODONE-acetaminophen 5-325 MG tablet Commonly known as: Percocet Take 1 tablet by mouth every 6 (six) hours as needed for up to 5 days for severe pain.          Mathis Dad, MD Orthopaedic Hand Surgeon [facility] Office number: [phone] [address], [address] [place], [place] [address]

## 2021-02-03 NOTE — Interval H&P Note (Signed)
History and Physical Interval Note:  02/03/2021 7:47 PM  Kayelynn Maura Crandall  has presented today for surgery, with the diagnosis of Left wrist fracture.  The various methods of treatment have been discussed with the patient and family. After consideration of risks, benefits and other options for treatment, the patient has consented to  Procedure(s) with comments: OPEN REDUCTION INTERNAL FIXATION (ORIF) WRIST FRACTURE (Left) - with MAC anesthesia   TO FOLLOW HIMSELF 1900 as a surgical intervention.  The patient's history has been reviewed, patient examined, no change in status, stable for surgery.  I have reviewed the patient's chart and labs.  Questions were answered to the patient's satisfaction.     Orene Desanctis

## 2021-02-03 NOTE — Anesthesia Postprocedure Evaluation (Signed)
Anesthesia Post Note  Patient: Darlene Miller  Procedure(s) Performed: OPEN REDUCTION INTERNAL FIXATION (ORIF) WRIST FRACTURE (Left: Wrist)     Patient location during evaluation: PACU Anesthesia Type: General Level of consciousness: awake Pain management: pain level controlled Vital Signs Assessment: post-procedure vital signs reviewed and stable Respiratory status: spontaneous breathing Cardiovascular status: stable Postop Assessment: no apparent nausea or vomiting Anesthetic complications: no   No notable events documented.  Last Vitals:  Vitals:   02/03/21 1651 02/03/21 2150  BP: 112/70 129/89  Pulse: 72 98  Resp: 17 16  Temp: 36.6 C 36.6 C  SpO2: 96% 99%    Last Pain:  Vitals:   02/03/21 2205  TempSrc:   PainSc: 7                  Joniya Boberg

## 2021-02-03 NOTE — Op Note (Signed)
OPERATIVE NOTE  DATE OF PROCEDURE: 02/03/2021  SURGEONS: Primary: Orene Desanctis, MD  PREOPERATIVE DIAGNOSIS: Left wrist fracture  POSTOPERATIVE DIAGNOSIS: Same  NAME OF PROCEDURE:    LEFT distal radius open reduction internal fixation, intra-articular, 3 fragments 2.    LEFT wrist brachioradialis tenotomy  3.    LEFT wrist radiographs four views with intraoperative interpretation   ANESTHESIA: LMA + Local + MAC  SKIN PREPARATION: Hibiclens  ESTIMATED BLOOD LOSS: Minimal  IMPLANTS: Biomet DVR Crosslock volar plate and screws  Implant Name Type Inv. Item Serial No. Manufacturer Lot No. LRB No. Used Action  PLATE CROSSLOCK NAR MINI LT - [id number] Plate PLATE CROSSLOCK NAR MINI LT  ZIMMER [employer]ON(ORTH,TRAU,BIO,SG)  Left 1 Implanted  PEG LOCKING SMOOTH 2.2X18 - [id number] Peg PEG LOCKING SMOOTH 2.2X18  ZIMMER [employer]ON(ORTH,TRAU,BIO,SG)  Left 4 Implanted  PEG LOCKING SMOOTH 2.2X16 - [id number] Screw PEG LOCKING SMOOTH 2.2X16  ZIMMER RECON(ORTH,TRAU,BIO,SG)  Left 1 Implanted  SCREW LOCKING 2.7X14 - [id number] Screw SCREW LOCKING 2.7X14  ZIMMER RECON(ORTH,TRAU,BIO,SG)  Left 4 Implanted    INDICATIONS:  Darlene Miller is a 30 y.o. female who has the above preoperative diagnosis. The patient has decided to proceed with surgical intervention.  Risks, benefits and alternatives of operative management were discussed including, but not limited to, risks of anesthesia complications, infection, pain, persistent symptoms, stiffness, need for future surgery.  The patient understands, agrees and elects to proceed with surgery.    DESCRIPTION OF PROCEDURE: The patient was met in the pre-operative area and their identity was verified.  The operative location and laterality was also verified and marked.  The patient was brought to the OR and was placed supine on the table.  After repeat patient identification with the operative team anesthesia was provided and the patient was prepped and draped in the usual sterile  fashion.  A final timeout was performed verifying the correction patient, procedure, location and laterality.  Preoperative antibiotics were administered. The LEFT upper extremity was exsanguinated with an Esmarch and tourniquet inflated to 229mHg. Under loupe magnification, an incision was made directly over the flexor carpi radialis (FCR) tendon. Bipolar was utilized for hemostasis. The roof of the FCR tendon sheath was incised. The FCR tendon was then retracted ulnarly to protect the palmar cutaneous branch of the median nerve. Subsequently, the floor of the FCR tendon sheath was incised over the distal end of the radius.  The flexor pollicis longus (FPL) was swept ulnarly to reveal the pronator quadratus.  The pronator quadratus fascia was incised from its distal and radial borders. A periosteal elevator was utilized to mobilize the pronator quadratus muscle off the distal radius.  The fracture site was irrigated and prepared for reduction with a freer and adson forceps. There were 3 distal radius fracture fragments. The brachioradialis tendon insertion was released to facilitate reduction.  This release was performed by identifying the broad insertion of the brachiradialis tendon and also identifying the 1st dorsal compartment tendons.  The 1st dorsal compartment tendons were protected, the broad tendon insertion was released under direct visualization. The fracture was reduced and provisionally fixed with K-wires. We then selected a proper length and width volar plate.  The plate was placed on the distal end of the radius with the fracture reduced and secured to the bone. Using mini C-arm the fracture reduction and position of the plate were deemed to be satisfactory.  We proceeded with securing the plate to the radius with the 1 bicortical nonlocking screw in the oblong portion of the  shaft.  With the intermediate column reduced, we secured the distal end of the plate with 2 screws in the distal ulnar  portion of plate.  We again used the C-arm to verify satisfactory plate position as well as fracture reduction. The radial column was then reduced and radial styloid locking screws were placed. The remainder shaft screws were placed through the plate. We used the mini C-arm to verify satisfactory plate position, screw lengths and fracture reduction. The DRUJ was then tested in neutral, pronation and supination and was found to be stable.The tourniquet was deflated. Meticulous hemostasis was obtained. The incision was copiously irrigated with normal saline and closed with interrupted 4-0 nylon horizontal mattress sutures. The incision was covered with xeroform, sterile guaze, webril and well padded short arm splint. The fingers were pink, warm and well perfused. All counts were correct. The patient was awoken from anesthesia and brought to PACU for recovery in stable condition.   Darlene Holmes, MD Orthopaedic Hand Surgery

## 2021-02-03 NOTE — Anesthesia Preprocedure Evaluation (Addendum)
Anesthesia Evaluation  Patient identified by MRN, date of birth, ID band Patient awake    Reviewed: Allergy & Precautions, NPO status , Patient's Chart, lab work & pertinent test results  Airway Mallampati: II  TM Distance: >3 FB     Dental   Pulmonary former smoker,    breath sounds clear to auscultation       Cardiovascular negative cardio ROS   Rhythm:Regular Rate:Normal     Neuro/Psych PSYCHIATRIC DISORDERS    GI/Hepatic negative GI ROS, Neg liver ROS,   Endo/Other    Renal/GU negative Renal ROS     Musculoskeletal   Abdominal   Peds  Hematology   Anesthesia Other Findings   Reproductive/Obstetrics                            Anesthesia Physical Anesthesia Plan  ASA: 1  Anesthesia Plan: General   Post-op Pain Management:    Induction:   PONV Risk Score and Plan: 3 and Ondansetron, Dexamethasone and Midazolam  Airway Management Planned: LMA  Additional Equipment:   Intra-op Plan:   Post-operative Plan: Extubation in OR  Informed Consent: I have reviewed the patients History and Physical, chart, labs and discussed the procedure including the risks, benefits and alternatives for the proposed anesthesia with the patient or authorized representative who has indicated his/her understanding and acceptance.     Dental advisory given  Plan Discussed with: CRNA and Anesthesiologist  Anesthesia Plan Comments:         Anesthesia Quick Evaluation

## 2021-02-03 NOTE — Anesthesia Procedure Notes (Signed)
Procedure Name: LMA Insertion Date/Time: 02/03/2021 8:09 PM Performed by: Natividad Schlosser T, CRNA Pre-anesthesia Checklist: Patient identified, Emergency Drugs available, Suction available and Patient being monitored Patient Re-evaluated:Patient Re-evaluated prior to induction Oxygen Delivery Method: Circle system utilized Preoxygenation: Pre-oxygenation with 100% oxygen Induction Type: IV induction Ventilation: Mask ventilation without difficulty LMA: LMA inserted LMA Size: 4.0 Number of attempts: 1 Placement Confirmation: positive ETCO2 and breath sounds checked- equal and bilateral Tube secured with: Tape Dental Injury: Teeth and Oropharynx as per pre-operative assessment

## 2021-02-09 ENCOUNTER — Encounter (HOSPITAL_COMMUNITY): Payer: Self-pay | Admitting: Orthopedic Surgery

## 2021-02-17 DIAGNOSIS — S52502D Unspecified fracture of the lower end of left radius, subsequent encounter for closed fracture with routine healing: Secondary | ICD-10-CM | POA: Diagnosis not present

## 2021-02-17 DIAGNOSIS — S52502A Unspecified fracture of the lower end of left radius, initial encounter for closed fracture: Secondary | ICD-10-CM | POA: Diagnosis not present

## 2021-02-21 DIAGNOSIS — M25632 Stiffness of left wrist, not elsewhere classified: Secondary | ICD-10-CM | POA: Diagnosis not present

## 2021-03-22 ENCOUNTER — Other Ambulatory Visit: Payer: Self-pay

## 2021-03-22 ENCOUNTER — Ambulatory Visit
Admission: EM | Admit: 2021-03-22 | Discharge: 2021-03-22 | Disposition: A | Discharge status: Home / Self Care | Payer: BC Managed Care – PPO | Attending: Internal Medicine | Admitting: Internal Medicine

## 2021-03-22 DIAGNOSIS — H65191 Other acute nonsuppurative otitis media, right ear: Secondary | ICD-10-CM

## 2021-03-22 MED ORDER — AMOXICILLIN 875 MG PO TABS: 875.0000 mg | ORAL_TABLET | Freq: Two times a day (BID) | ORAL | 0 refills | Status: AC

## 2021-03-22 NOTE — ED Triage Notes (Signed)
1-1.5 wk h/o right ear pain. Has been using ear drops w/o relief. Ear is tender to the touch.

## 2021-03-22 NOTE — Discharge Instructions (Signed)
You have an ear infection which is being treated with antibiotics.  Please follow-up if symptoms persist or worsen.

## 2021-03-22 NOTE — ED Provider Notes (Signed)
[facility]    CSN: [id number] Arrival date & time: 03/22/21  0802      History   Chief Complaint Chief Complaint  Patient presents with   Otalgia    right    HPI Darlene Miller is a 31 y.o. female.   Patient presents with right ear pain that has been present for approximately 1 to 2 weeks.  Denies any trauma or, foreign body, drainage, decreased hearing from the ear.  Denies any associated upper respiratory symptoms or fever.  Has been using over-the-counter eardrops with minimal improvement of symptoms.   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   CESAREAN SECTION N/A 06/08/2016   Procedure: CESAREAN SECTION;  Surgeon: Harlin Heys, MD;  Location: [facility];  Service: Obstetrics;  Laterality: N/A;  Female born @ [birth year] Weight: 5lbs  Apgars: 8/9   NECK SURGERY     mass on neck- benign   ORIF WRIST FRACTURE Left 02/03/2021   Procedure: OPEN REDUCTION INTERNAL FIXATION (ORIF) WRIST FRACTURE;  Surgeon: Orene Desanctis, MD;  Location: [facility];  Service: Orthopedics;  Laterality: Left;  with MAC anesthesia   TO FOLLOW HIMSELF 1900    OB History     Gravida  1   Para  1   Term  1   Preterm  0   AB  0   Living  1      SAB  0   IAB  0   Ectopic  0   Multiple  0   Live Births  1        Obstetric Comments  G1- IUGR in pregnancy           Home Medications    Prior to Admission medications   Medication Sig Start Date End Date Taking? Authorizing Provider  amoxicillin (AMOXIL) 875 MG tablet Take 1 tablet (875 mg total) by mouth 2 (two) times daily for 10 days. 03/22/21 04/01/21 Yes Jax Abdelrahman, Michele Rockers, FNP  calcium carbonate (TUMS - DOSED IN MG ELEMENTAL CALCIUM) 500 MG chewable tablet Chew 2 tablets by mouth daily as needed for indigestion or heartburn.    [provider]  cetirizine (ZYRTEC ALLERGY) 10 MG tablet Take 1 tablet (10 mg total) by mouth daily. Patient taking differently: Take 10 mg by mouth daily as needed for allergies. 09/02/20   Raspet, Derry Skill, PA-C  ibuprofen (ADVIL) 800 MG tablet Take 800 mg by mouth every 8 (eight) hours as needed for moderate pain.    [provider]  norethindrone-ethinyl estradiol-FE (JUNEL FE 1/20) 1-20 MG-MCG tablet Take 1 tablet by mouth daily. 12/27/20   Rubie Maid, MD    Family History Family History  Problem Relation Age of Onset   Hypertension Mother    Restless legs syndrome Mother    COPD Mother    Cancer Neg Hx  Diabetes Neg Hx    Heart disease Neg Hx     Social History Social History   Tobacco Use   Smoking status: Former    Packs/day: 0.25    Types: Cigarettes    Quit date: 08/06/2015    Years since quitting: 5.6   Smokeless tobacco: Never  Vaping Use   Vaping Use: Some days   Substances: Nicotine, Flavoring  Substance Use Topics   Alcohol use: Yes    Comment: occas   Drug use: No     Allergies   Patient has no known allergies.   Review of Systems Review of Systems Per HPI  Physical Exam Triage Vital Signs ED Triage Vitals  Enc Vitals Group     BP 03/22/21 0811 109/70     Pulse Rate 03/22/21 0811 81     Resp 03/22/21 0811 18     Temp 03/22/21 0811 98.3 F (36.8 C)     Temp Source 03/22/21 0811 Oral     SpO2 03/22/21 0811 98 %     Weight --      Height --      Head Circumference --      Peak Flow --      Pain Score  03/22/21 0813 2     Pain Loc --      Pain Edu? --      Excl. in [place]? --    No data found.  Updated Vital Signs BP 109/70 (BP Location: Left Arm)    Pulse 81    Temp 98.3 F (36.8 C) (Oral)    Resp 18    SpO2 98%   Visual Acuity Right Eye Distance:   Left Eye Distance:   Bilateral Distance:    Right Eye Near:   Left Eye Near:    Bilateral Near:     Physical Exam Constitutional:      General: She is not in acute distress.    Appearance: Normal appearance. She is not toxic-appearing or diaphoretic.  HENT:     Head: Normocephalic and atraumatic.     Right Ear: Ear canal and external ear normal. No drainage, swelling or tenderness. No middle ear effusion. No mastoid tenderness. Tympanic membrane is erythematous. Tympanic membrane is not perforated or bulging.  Eyes:     Extraocular Movements: Extraocular movements intact.     Conjunctiva/sclera: Conjunctivae normal.  Pulmonary:     Effort: Pulmonary effort is normal.  Neurological:     General: No focal deficit present.     Mental Status: She is alert and oriented to person, place, and time. Mental status is at baseline.  Psychiatric:        Mood and Affect: Mood normal.        Behavior: Behavior normal.        Thought Content: Thought content normal.        Judgment: Judgment normal.     UC Treatments / Results  Labs (all labs ordered are listed, but only abnormal results are displayed) Labs Reviewed - No data to display  EKG   Radiology No results found.  Procedures Procedures (including critical care time)  Medications Ordered in UC Medications - No data to display  Initial Impression / Assessment and Plan / UC Course  I have reviewed the triage vital signs and the nursing notes.  Pertinent labs & imaging results that were available during my care of the patient were reviewed by me and considered in my medical decision making (see chart  for details).     Patient has right otitis media.  Will treat with  amoxicillin antibiotic.  Discussed supportive care with patient.  Discussed return precautions.  Patient verbalized understanding and was agreeable with plan. Final Clinical Impressions(s) / UC Diagnoses   Final diagnoses:  Other non-recurrent acute nonsuppurative otitis media of right ear     Discharge Instructions      You have an ear infection which is being treated with antibiotics.  Please follow-up if symptoms persist or worsen.    ED Prescriptions     Medication Sig Dispense Auth. Provider   amoxicillin (AMOXIL) 875 MG tablet Take 1 tablet (875 mg total) by mouth 2 (two) times daily for 10 days. 20 tablet [place], Michele Rockers, [place]      PDMP not reviewed this encounter.   Teodora Medici, [place] 03/22/21 [phone]

## 2021-05-15 NOTE — Progress Notes (Signed)
? ? ?  GYNECOLOGY PROGRESS NOTE ? ?Subjective:  ? ? Patient ID: Darlene Miller, female    DOB: Nov 01, 1990, 31 y.o.   MRN: [id number] ? ?HPI ? Patient is a 31 y.o. G55P1001 female who presents to discuss resuming Depo Provera.  Patient stopped Depo-Provera use in November due to prolonged use of Depo-Provera for 4 years.  Was initiated on OCPs however patient notes that she is having difficulties remaining compliant with the pills.  Not used pills within the past month.  Would like to resume Depo if possible.  Also is noting that her menstrual cycles are much heavier with use of the pills.  LMP was ~ 1.5 weeks ago.  Does report that she had unprotected intercourse approximately 2 weeks ago for her cycle. ? ?The following portions of the patient's history were reviewed and updated as appropriate: allergies, current medications, past family history, past medical history, past social history, past surgical history, and problem list. ? ?Review of Systems ?Pertinent items noted in HPI and remainder of comprehensive ROS otherwise negative.  ? ?Objective:  ? Blood pressure 118/75, pulse 71, resp. rate 16, height 5' (1.524 m), weight 119 lb (54 kg). Body mass index is 23.24 kg/m?. ?General appearance: alert and no distress ?Abdomen: soft, non-tender; bowel sounds normal; no masses,  no organomegaly ?Pelvic: Deferred ?Labs:  ?Results for orders placed or performed in visit on 05/16/21  ?POCT urine pregnancy  ?Result Value Ref Range  ? Preg Test, Ur Negative Negative  ? ? ?Assessment:  ? ?1. Encounter for counseling regarding contraception   ?2. Depo-Provera contraceptive status   ?  ? ?Plan:  ? ?Discussed all methods of contraception including resuming her Depo-Provera, LARC contraceptives, etc.  Patient currently not very compliant with her pills.  Would like to resume Depo-Provera use.  I discussed with patient the risk of reversible bone loss with long-term use.  Patient has had 3 months off of medication.  At this time I  would recommend that she undergo a bone density scan to continue use of the Depo-Provera.  Also would encourage her to utilize vitamin D and calcium supplementation if she plans to use for more prolonged period of time.  Depo-Provera may also help with patient's heavier menstrual cycles.  PT done today, negative.  First injection given today, prescription sent to her pharmacy.  Return in 3 months for next Depo injection. ? ? ? ?Hildred Laser, MD ?[facility]  ?

## 2021-05-16 ENCOUNTER — Encounter: Payer: Self-pay | Admitting: Obstetrics and Gynecology

## 2021-05-16 ENCOUNTER — Ambulatory Visit (INDEPENDENT_AMBULATORY_CARE_PROVIDER_SITE_OTHER): Payer: Self-pay | Admitting: Obstetrics and Gynecology

## 2021-05-16 VITALS — BP 118/75 | HR 71 | Resp 16 | Ht 60.0 in | Wt 119.0 lb

## 2021-05-16 DIAGNOSIS — Z3009 Encounter for other general counseling and advice on contraception: Secondary | ICD-10-CM

## 2021-05-16 DIAGNOSIS — Z3042 Encounter for surveillance of injectable contraceptive: Secondary | ICD-10-CM

## 2021-05-16 LAB — POCT URINE PREGNANCY: Preg Test, Ur: NEGATIVE

## 2021-05-16 MED ORDER — MEDROXYPROGESTERONE ACETATE 150 MG/ML IM SUSP: 150.0000 mg | INTRAMUSCULAR | 3 refills | Status: DC

## 2021-05-16 MED ORDER — MEDROXYPROGESTERONE ACETATE 150 MG/ML IM SUSP
150.0000 mg | INTRAMUSCULAR | Status: AC
  Administered 2021-05-16 – 2021-08-17 (×3): 150 mg via INTRAMUSCULAR

## 2021-05-16 NOTE — Progress Notes (Signed)
Date last pap: 12/23/2019. ?Last Depo-Provera: 05/16/2021. ?Side Effects if any: None. ?Serum HCG indicated? Negative. ?Depo-Provera 150 mg IM given by: Cristy Folks, CMA. ?Next appointment due: June 27- July 11.  ?

## 2021-08-07 ENCOUNTER — Ambulatory Visit: Payer: Self-pay

## 2021-08-11 ENCOUNTER — Ambulatory Visit (INDEPENDENT_AMBULATORY_CARE_PROVIDER_SITE_OTHER): Payer: Self-pay | Admitting: Obstetrics and Gynecology

## 2021-08-11 VITALS — BP 103/83 | HR 81 | Resp 16 | Ht 60.0 in | Wt 123.6 lb

## 2021-08-11 DIAGNOSIS — Z3042 Encounter for surveillance of injectable contraceptive: Secondary | ICD-10-CM

## 2021-08-11 NOTE — Patient Instructions (Signed)
Medroxyprogesterone Injection (Contraception) ?What is this medication? ?MEDROXYPROGESTERONE (me DROX ee proe JES te rone) prevents ovulation and pregnancy. It belongs to a group of medications called contraceptives. This medication is a progestin hormone. ?This medicine may be used for other purposes; ask your health care provider or pharmacist if you have questions. ?COMMON BRAND NAME(S): Depo-Provera, Depo-subQ Provera 104 ?What should I tell my care team before I take this medication? ?They need to know if you have any of these conditions: ?Asthma ?Blood clots ?Breast cancer or family history of breast cancer ?Depression ?Diabetes ?Eating disorder (anorexia nervosa) ?Heart attack ?High blood pressure ?HIV infection or AIDS ?If you often drink alcohol ?Kidney disease ?Liver disease ?Migraine headaches ?Osteoporosis, weak bones ?Seizures ?Stroke ?Tobacco smoker ?Vaginal bleeding ?An unusual or allergic reaction to medroxyprogesterone, other hormones, medications, foods, dyes, or preservatives ?Pregnant or trying to get pregnant ?Breast-feeding ?How should I use this medication? ?Depo-Provera CI contraceptive injection is given into a muscle. Depo-subQ Provera 104 injection is given under the skin. It is given in a hospital or clinic setting. The injection is usually given during the first 5 days after the start of a menstrual period or 6 weeks after delivery of a baby. ?A patient package insert for the product will be given with each prescription and refill. Be sure to read this information carefully each time. The sheet may change often. ?Talk to your care team about the use of this medication in children. Special care may be needed. These injections have been used in female children who have started having menstrual periods. ?Overdosage: If you think you have taken too much of this medicine contact a poison control center or emergency room at once. ?NOTE: This medicine is only for you. Do not share this medicine  with others. ?What if I miss a dose? ?Keep appointments for follow-up doses. You must get an injection once every 3 months. It is important not to miss your dose. Call your care team if you are unable to keep an appointment. ?What may interact with this medication? ?Antibiotics or medications for infections, especially rifampin and griseofulvin ?Antivirals for HIV or hepatitis ?Aprepitant ?Armodafinil ?Bexarotene ?Bosentan ?Medications for seizures like carbamazepine, felbamate, oxcarbazepine, phenytoin, phenobarbital, primidone, topiramate ?Mitotane ?Modafinil ?St. John's wort ?This list may not describe all possible interactions. Give your health care provider a list of all the medicines, herbs, non-prescription drugs, or dietary supplements you use. Also tell them if you smoke, drink alcohol, or use illegal drugs. Some items may interact with your medicine. ?What should I watch for while using this medication? ?This medication does not protect you against HIV infection (AIDS) or other sexually transmitted diseases. ?Use of this product may cause you to lose calcium from your bones. Loss of calcium may cause weak bones (osteoporosis). Only use this product for more than 2 years if other forms of birth control are not right for you. The longer you use this product for birth control the more likely you will be at risk for weak bones. Ask your care team how you can keep strong bones. ?You may have a change in bleeding pattern or irregular periods. Many females stop having periods while taking this medication. ?If you have received your injections on time, your chance of being pregnant is very low. If you think you may be pregnant, see your care team as soon as possible. ?Tell your care team if you want to get pregnant within the next year. The effect of this medication may last a   long time after you get your last injection. ?What side effects may I notice from receiving this medication? ?Side effects that you should  report to your care team as soon as possible: ?Allergic reactions--skin rash, itching, hives, swelling of the face, lips, tongue, or throat ?Blood clot--pain, swelling, or warmth in the leg, shortness of breath, chest pain ?Gallbladder problems--severe stomach pain, nausea, vomiting, fever ?Increase in blood pressure ?Liver injury--right upper belly pain, loss of appetite, nausea, light-colored stool, dark yellow or brown urine, yellowing skin or eyes, unusual weakness or fatigue ?New or worsening migraines or headaches ?Seizures ?Stroke--sudden numbness or weakness of the face, arm, or leg, trouble speaking, confusion, trouble walking, loss of balance or coordination, dizziness, severe headache, change in vision ?Unusual vaginal discharge, itching, or odor ?Worsening mood, feelings of depression ?Side effects that usually do not require medical attention (report to your care team if they continue or are bothersome): ?Breast pain or tenderness ?Dark patches of the skin on the face or other sun-exposed areas ?Irregular menstrual cycles or spotting ?Nausea ?Weight gain ?This list may not describe all possible side effects. Call your doctor for medical advice about side effects. You may report side effects to FDA at 1-800-FDA-1088. ?Where should I keep my medication? ?This injection is only given by a care team. It will not be stored at home. ?NOTE: This sheet is a summary. It may not cover all possible information. If you have questions about this medicine, talk to your doctor, pharmacist, or health care provider. ?? 2023 Elsevier/Gold Standard (2020-03-27 00:00:00) ? ?

## 2021-08-11 NOTE — Progress Notes (Unsigned)
Date last pap: 12/23/2019. Last Depo-Provera: 05/16/2021. Side Effects if any: None. Serum HCG indicated? N/A Depo-Provera 150 mg IM given by: Santiago Bumpers, CMA. Next appointment due: September 22 - October 6

## 2021-10-24 ENCOUNTER — Encounter: Payer: Self-pay | Admitting: Obstetrics and Gynecology

## 2021-10-24 NOTE — Progress Notes (Deleted)
    GYNECOLOGY PROGRESS NOTE  Subjective:    Patient ID: Darlene Miller, female    DOB: [birth year], 31 y.o.   MRN: [id number]  HPI  Patient is a 31 y.o. G24P1001 female who presents for   {Common ambulatory SmartLinks:19316}  Review of Systems {ros; complete:30496}   Objective:   There were no vitals taken for this visit. There is no height or weight on file to calculate BMI. General appearance: {general exam:16600} Abdomen: {abdominal exam:16834} Pelvic: {pelvic exam:16852::"cervix normal in appearance","external genitalia normal","no adnexal masses or tenderness","no cervical motion tenderness","rectovaginal septum normal","uterus normal size, shape, and consistency","vagina normal without discharge"} Extremities: {extremity exam:5109} Neurologic: {neuro exam:17854}   Assessment:   No diagnosis found.   Plan:   There are no diagnoses linked to this encounter.

## 2021-11-10 ENCOUNTER — Ambulatory Visit: Payer: Self-pay

## 2021-11-10 ENCOUNTER — Ambulatory Visit (INDEPENDENT_AMBULATORY_CARE_PROVIDER_SITE_OTHER): Payer: BC Managed Care – PPO | Admitting: Obstetrics and Gynecology

## 2021-11-10 VITALS — BP 103/72 | HR 68 | Ht 60.0 in | Wt 123.6 lb

## 2021-11-10 DIAGNOSIS — Z3042 Encounter for surveillance of injectable contraceptive: Secondary | ICD-10-CM | POA: Diagnosis not present

## 2021-11-10 MED ORDER — MEDROXYPROGESTERONE ACETATE 150 MG/ML IM SUSP
150.0000 mg | Freq: Once | INTRAMUSCULAR | Status: AC
  Administered 2021-11-10: 150 mg via INTRAMUSCULAR

## 2021-11-10 NOTE — Progress Notes (Signed)
Date last pap: 12/23/2019. Last Depo-Provera: 08/11/2021. Side Effects if any: None. Serum HCG indicated? N/A Depo-Provera 150 mg IM given by: Douglass Rivers, CMA Next appointment due: 01/26/22- 02/09/22  Patient reports 2 cases of abnormal bleeding since restarting depo over the last 3 months. Advised patient abnormal bleeding can be normal when starting/restarting birth control. Advised patient to monitor and contact the office if the bleeding does not stop. Patient voiced understanding.

## 2021-11-13 ENCOUNTER — Encounter: Payer: Self-pay | Admitting: Obstetrics and Gynecology

## 2021-12-12 ENCOUNTER — Ambulatory Visit
Admission: EM | Admit: 2021-12-12 | Discharge: 2021-12-12 | Disposition: A | Discharge status: Home / Self Care | Payer: BC Managed Care – PPO | Attending: Physician Assistant | Admitting: Physician Assistant

## 2021-12-12 DIAGNOSIS — Z1152 Encounter for screening for COVID-19: Secondary | ICD-10-CM | POA: Diagnosis not present

## 2021-12-12 DIAGNOSIS — H65191 Other acute nonsuppurative otitis media, right ear: Secondary | ICD-10-CM | POA: Insufficient documentation

## 2021-12-12 DIAGNOSIS — J069 Acute upper respiratory infection, unspecified: Secondary | ICD-10-CM | POA: Insufficient documentation

## 2021-12-12 MED ORDER — AMOXICILLIN 500 MG PO CAPS: 500.0000 mg | ORAL_CAPSULE | Freq: Three times a day (TID) | ORAL | 0 refills | Status: DC

## 2021-12-12 NOTE — ED Triage Notes (Signed)
Pt presents to [facility] with co of  R sided otalgia and cough for 1 week. Pt also reports people at work are positive for Covid. Pt has taken motrin and bc fpr symtpoms

## 2021-12-12 NOTE — ED Provider Notes (Signed)
[facility]    CSN: [id number] Arrival date & time: 12/12/21  0800      History   Chief Complaint Chief Complaint  Patient presents with   Otalgia   Cough    HPI Darlene Miller is a 31 y.o. female.   Patient here today for evaluation of right-sided ear pain and cough that she has had for about a week.  She has not had fever.  She does report some body aches.  She has not taken over-the-counter medication with mild relief.  She reports that people at work have tested positive for [place] and her employer wanted her to be screened as well.  The history is provided by the patient.    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   CESAREAN SECTION N/A 06/08/2016   Procedure: CESAREAN SECTION;  Surgeon: Harlin Heys, MD;  Location: [facility];  Service: Obstetrics;  Laterality: N/A;  Female born @ 38 Weight: 5lbs  Apgars: 8/9   NECK SURGERY     mass on neck- benign   ORIF WRIST FRACTURE Left 02/03/2021   Procedure: OPEN REDUCTION INTERNAL FIXATION (ORIF) WRIST FRACTURE;  Surgeon: Orene Desanctis, MD;  Location: [facility];  Service: Orthopedics;  Laterality: Left;  with MAC anesthesia   TO FOLLOW HIMSELF 1900    OB History     Gravida  1   Para  1   Term  1   Preterm  0   AB  0   Living  1      SAB  0   IAB  0   Ectopic  0   Multiple  0   Live Births  1         Obstetric Comments  G1- IUGR in pregnancy          Home Medications    Prior to Admission medications   Medication Sig Start Date End Date Taking? Authorizing Provider  amoxicillin (AMOXIL) 500 MG capsule Take 1 capsule (500 mg total) by mouth 3 (three) times daily. 12/12/21  Yes Francene Finders, PA-C  calcium carbonate (TUMS - DOSED IN MG ELEMENTAL CALCIUM) 500 MG chewable tablet Chew 2 tablets by mouth daily as needed for indigestion or heartburn.    [provider]  cetirizine (ZYRTEC ALLERGY) 10 MG tablet Take 1 tablet (10 mg total) by mouth daily. Patient taking differently: Take 10 mg by mouth daily as needed for allergies. 09/02/20   Raspet, Derry Skill, PA-C  ibuprofen (ADVIL) 800 MG tablet Take 800 mg by mouth every 8 (eight) hours as needed for moderate pain.    [provider]  medroxyPROGESTERone (DEPO-PROVERA) 150 MG/ML injection Inject 1 mL (150 mg total) into the muscle every 3 (three) months. 05/16/21   Rubie Maid, MD    Family History Family History  Problem Relation Age of Onset   Hypertension  Mother    Restless legs syndrome Mother    COPD Mother    Cancer Neg Hx    Diabetes Neg Hx    Heart disease Neg Hx     Social History Social History   Tobacco Use   Smoking status: Former    Packs/day: 0.25    Types: Cigarettes    Quit date: 08/06/2015    Years since quitting: 6.3   Smokeless tobacco: Never  Vaping Use   Vaping Use: Some days   Substances: Nicotine, Flavoring  Substance Use Topics   Alcohol use: Yes    Comment: occas   Drug use: No     Allergies   Patient has no known allergies.   Review of Systems Review of Systems  Constitutional:  Negative for chills and fever.  HENT:  Positive for congestion and ear pain. Negative for sore throat.   Eyes:  Negative for discharge and redness.  Respiratory:  Positive for cough. Negative for shortness of breath and wheezing.   Gastrointestinal:  Negative for abdominal pain,  diarrhea, nausea and vomiting.     Physical Exam Triage Vital Signs ED Triage Vitals  Enc Vitals Group     BP      Pulse      Resp      Temp      Temp src      SpO2      Weight      Height      Head Circumference      Peak Flow      Pain Score      Pain Loc      Pain Edu?      Excl. in [place]?    No data found.  Updated Vital Signs BP 114/82   Pulse 81   Temp 97.9 F (36.6 C)   Resp 19   SpO2 98%      Physical Exam Vitals and nursing note reviewed.  Constitutional:      General: She is not in acute distress.    Appearance: Normal appearance. She is not ill-appearing.  HENT:     Head: Normocephalic and atraumatic.     Left Ear: Tympanic membrane normal.     Ears:     Comments: Right TM erythematous    Nose: Congestion (mild) present.     Mouth/Throat:     Mouth: Mucous membranes are moist.     Pharynx: No oropharyngeal exudate or posterior oropharyngeal erythema.  Eyes:     Conjunctiva/sclera: Conjunctivae normal.  Cardiovascular:     Rate and Rhythm: Normal rate and regular rhythm.     Heart sounds: Normal heart sounds. No murmur heard. Pulmonary:     Effort: Pulmonary effort is normal. No respiratory distress.     Breath sounds: Normal breath sounds. No wheezing, rhonchi or rales.  Skin:    General: Skin is warm and dry.  Neurological:     Mental Status: She is alert.  Psychiatric:        Mood and Affect: Mood normal.        Thought Content: Thought content normal.      UC Treatments / Results  Labs (all labs ordered are listed, but only abnormal results are displayed) Labs Reviewed  SARS CORONAVIRUS 2 (TAT 6-24 HRS)    EKG   Radiology No results found.  Procedures Procedures (including critical care time)  Medications Ordered in UC Medications - No data to display  Initial Impression / Assessment and  Plan / UC Course  I have reviewed the triage vital signs and the nursing notes.  Pertinent labs & imaging results that were  available during my care of the patient were reviewed by me and considered in my medical decision making (see chart for details).    Will treat to cover otitis media with amoxicillin. Will screen for covid at patient request but discussed that if positive she would be considered outside of quarantine window and could report to work- note provided for same. Encouraged follow up if no gradual improvement or with any further concerns.   Final Clinical Impressions(s) / UC Diagnoses   Final diagnoses:  Acute upper respiratory infection  Other acute nonsuppurative otitis media of right ear, recurrence not specified  Encounter for screening for COVID-19   Discharge Instructions   None    ED Prescriptions     Medication Sig Dispense Auth. Provider   amoxicillin (AMOXIL) 500 MG capsule Take 1 capsule (500 mg total) by mouth 3 (three) times daily. 21 capsule Francene Finders, PA-C      PDMP not reviewed this encounter.   Francene Finders, PA-C 12/12/21 [phone]

## 2021-12-13 LAB — SARS CORONAVIRUS 2 (TAT 6-24 HRS): SARS Coronavirus 2: NEGATIVE

## 2022-02-01 NOTE — Progress Notes (Signed)
    NURSE VISIT NOTE  Subjective:    Patient ID: Darlene Miller, female    DOB: Jun 21, 1990, 31 y.o.   MRN: [id number]  HPI  Patient is a 31 y.o. G19P1001 female who presents for depo provera injection.   Objective:    BP 108/73   Pulse 76   Wt 126 lb (57.2 kg)   BMI 24.61 kg/m   Date last pap: 08/11/2021. Last Depo-Provera: 11/10/2021. Side Effects if any: none. Serum HCG indicated? No . Depo-Provera 150 mg IM given by: Beverely Pace, CMA. Site: Right Ventrogluteal  Lab Review  @THIS  VISIT ONLY@  Assessment:   1. Encounter for management and injection of depo-Provera      Plan:   Next appointment due between march 16 and 30.    March 18, CMA

## 2022-02-02 ENCOUNTER — Ambulatory Visit (INDEPENDENT_AMBULATORY_CARE_PROVIDER_SITE_OTHER): Payer: BC Managed Care – PPO

## 2022-02-02 VITALS — BP 108/73 | HR 76 | Ht 60.0 in | Wt 126.0 lb

## 2022-02-02 DIAGNOSIS — Z3042 Encounter for surveillance of injectable contraceptive: Secondary | ICD-10-CM

## 2022-02-02 MED ORDER — MEDROXYPROGESTERONE ACETATE 150 MG/ML IM SUSP
150.0000 mg | Freq: Once | INTRAMUSCULAR | Status: AC
  Administered 2022-02-02: 150 mg via INTRAMUSCULAR

## 2022-04-17 ENCOUNTER — Telehealth: Payer: Self-pay

## 2022-04-17 NOTE — Telephone Encounter (Signed)
Pt calling for refill of her depo.  [phone] Pt aware not to worry about going to p/u refill b/c we have it here now.

## 2022-04-26 ENCOUNTER — Ambulatory Visit (INDEPENDENT_AMBULATORY_CARE_PROVIDER_SITE_OTHER): Payer: BC Managed Care – PPO

## 2022-04-26 ENCOUNTER — Telehealth: Payer: Self-pay

## 2022-04-26 VITALS — BP 100/60 | Ht 60.0 in | Wt 125.0 lb

## 2022-04-26 DIAGNOSIS — Z3042 Encounter for surveillance of injectable contraceptive: Secondary | ICD-10-CM

## 2022-04-26 MED ORDER — MEDROXYPROGESTERONE ACETATE 150 MG/ML IM SUSY: 150.0000 mg | PREFILLED_SYRINGE | Freq: Once | INTRAMUSCULAR | Status: DC

## 2022-04-26 MED ORDER — MEDROXYPROGESTERONE ACETATE 150 MG/ML IM SUSP
150.0000 mg | Freq: Once | INTRAMUSCULAR | Status: AC
  Administered 2022-04-26: 150 mg via INTRAMUSCULAR

## 2022-04-26 NOTE — Telephone Encounter (Signed)
Pt calling; has appt at 4:15 for depo inj; is able to come earlier if it's okay to do so; please call to let her know when.  [phone]

## 2022-04-26 NOTE — Telephone Encounter (Signed)
I called pt and she's aware she can come in early. She's also aware she needs to schedule her annual or she will not be able to get the next depo.

## 2022-04-26 NOTE — Progress Notes (Signed)
    NURSE VISIT NOTE  Subjective:    Patient ID: Darlene Miller, Miller    DOB: 1990-07-28, 32 y.o.   MRN: [id number]  HPI  Patient is Darlene Miller who presents for depo provera injection.   Objective:    There were no vitals taken for this visit.  Last Annual: 12/27/20. Last pap: 12/23/19. Last Depo-Provera: 02/02/22. Side Effects if any: none. Serum HCG indicated? No . Depo-Provera 150 mg IM given by: Quintella Baton, CMA. Site: Right Ventrogluteal  Lab Review  @THIS  VISIT ONLY@  Assessment:   No diagnosis found.   Plan:   Next appointment due between June 6 and June 20.    Quintella Baton, CMA

## 2022-04-26 NOTE — Patient Instructions (Signed)
Contraceptive Injection A contraceptive injection is a shot that prevents pregnancy. It is also called a birth control shot. The shot contains the hormone progestin, which prevents pregnancy by: Stopping the ovaries from releasing eggs. Thickening cervical mucus to prevent sperm from entering the cervix. Thinning the lining of the uterus to prevent a fertilized egg from attaching to the uterus. Contraceptive injections are given under the skin (subcutaneous) or into a muscle (intramuscular). For these shots to work, you must get one of them every 3 months (12-13 weeks) from a health care provider. Tell a health care provider about: Any allergies you have. All medicines you are taking, including vitamins, herbs, eye drops, creams, and over-the-counter medicines. Any blood disorders you have. Any medical conditions you have. Whether you are pregnant or may be pregnant. What are the risks? Generally, this is a safe procedure. However, problems may occur, including: Mood changes or depression. Loss of bone density (osteoporosis) after long-term use. Blood clots. This is rare. Higher risk of an egg being fertilized outside your uterus (ectopic pregnancy).This is rare. What happens before the procedure? Your health care provider may do a routine physical exam. You may have a test to make sure you are not pregnant. What happens during the procedure?  The area where the shot will be given will be cleaned and sanitized with alcohol. A needle will be inserted into a muscle in your upper arm or buttock, or into the skin of your thigh or abdomen. The needle will be attached to a syringe with the medicine inside of it. The medicine will be pushed through the syringe and injected into your body. A small bandage (dressing) may be placed over the injection site. What can I expect after the procedure? After the procedure, it is common to have: Soreness around the injection site for a couple of  days. Irregular menstrual bleeding. Weight gain. Breast tenderness. Headaches. Discomfort in your abdomen. Ask your health care provider whether you need to use an added method of birth control (backup contraception), such as a condom, sponge, or spermicide. If the first shot is given 1-7 days after the start of your last menstrual period, you will not need backup contraception. If the first shot is given at any other time during your menstrual cycle, you should avoid having sex. If you do have sex, you will need to use backup contraception for 7 days after you receive the shot. Follow these instructions at home: General instructions Take over-the-counter and prescription medicines only as told by your health care provider. Do not rub or massage the injection site. Track your menstrual periods so you will know if they become irregular. Always use a condom to protect against sexually transmitted infections (STIs). Make sure you schedule an appointment in time for your next shot and mark it on your calendar. You must get an injection every 3 months (12-13 weeks) to prevent pregnancy. Lifestyle Do not use any products that contain nicotine or tobacco. These products include cigarettes, chewing tobacco, and vaping devices, such as e-cigarettes. If you need help quitting, ask your health care provider. Eat foods that are high in calcium and vitamin D, such as milk, cheese, and salmon. Doing this may help with any loss in bone density caused by the contraceptive injection. Ask your health care provider for dietary recommendations. Contact a health care provider if you: Have nausea or vomiting. Have abnormal vaginal discharge or bleeding. Miss a menstrual period or think you might be pregnant. Experience mood changes   or depression. Feel dizzy or light-headed. Have leg pain. Get help right away if you: Have chest pain or cough up blood. Have shortness of breath. Have a severe headache that does  not go away. Have numbness in any part of your body. Have slurred speech or vision problems. Have vaginal bleeding that is abnormally heavy or does not stop, or you have severe pain in your abdomen. Have depression that does not get better with treatment. If you ever feel like you may hurt yourself or others, or have thoughts about taking your own life, get help right away. Go to your nearest emergency department or: Call your local emergency services (911 in the [place] Call a suicide crisis helpline, such as the National Suicide Prevention Lifeline at 1-800-273-8255 or 988 in the [place] This is open 24 hours a day in the [place] Text the Crisis Text Line at 741741 (in the [place] Summary A contraceptive injection is a shot that prevents pregnancy. It is also called the birth control shot. The shot is given under the skin (subcutaneous) or into a muscle (intramuscular). After this procedure, it is common to have soreness around the injection site for a couple of days. To prevent pregnancy, the shot must be given by a health care provider every 3 months (12-13 weeks). After you have the shot, ask your health care provider whether you need to use an added method of birth control (backup contraception), such as a condom, sponge, or spermicide. This information is not intended to replace advice given to you by your health care provider. Make sure you discuss any questions you have with your health care provider. Document Revised: 08/17/2020 Document Reviewed: 08/03/2019 Elsevier Patient Education  2023 [employer]vier Inc.  

## 2022-06-27 DIAGNOSIS — Z131 Encounter for screening for diabetes mellitus: Secondary | ICD-10-CM | POA: Diagnosis not present

## 2022-06-27 DIAGNOSIS — Z13220 Encounter for screening for lipoid disorders: Secondary | ICD-10-CM | POA: Diagnosis not present

## 2022-06-27 DIAGNOSIS — F411 Generalized anxiety disorder: Secondary | ICD-10-CM | POA: Diagnosis not present

## 2022-07-06 NOTE — Progress Notes (Deleted)
GYNECOLOGY ANNUAL PHYSICAL EXAM PROGRESS NOTE  Subjective:    Summar Darlene Miller is a 32 y.o. G4P1001 female who presents for an annual exam. The patient has no complaints today. The patient {is/is not/has never been:13135} sexually active. The patient participates in regular exercise: {yes/no/not asked:9010}. Has the patient ever been transfused or tattooed?: {yes/no/not asked:9010}. The patient reports that there {is/is not:9024} domestic violence in her life.    Menstrual History: Menarche age: [age] No LMP recorded. Patient has had an injection.     Gynecologic History:  Contraception: Depo-Provera injections(has been on it for ~ 4 years).  History of STI's: HSV II Last Pap: 12/23/2019. Results were: normal.   Reports h/o abnormal pap smear in 2017 (LGSIL, followed by colposcopy, normal biopsy).   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   CESAREAN SECTION N/A 06/08/2016   Procedure: CESAREAN SECTION;  Surgeon: Linzie Collin, MD;  Location: [facility] ORS;  Service: Obstetrics;  Laterality: N/A;  Female born @ [birth year] Weight: 5lbs  Apgars: 8/9   NECK SURGERY     mass on neck- benign   ORIF WRIST FRACTURE Left 02/03/2021   Procedure: OPEN REDUCTION INTERNAL FIXATION (ORIF) WRIST FRACTURE;  Surgeon: Gomez Cleverly, MD;  Location: [facility];  Service: Orthopedics;  Laterality: Left;  with MAC anesthesia   TO FOLLOW HIMSELF 1900    Family History  Problem  Relation Age of Onset   Hypertension Mother    Restless legs syndrome Mother    COPD Mother    Cancer Neg Hx    Diabetes Neg Hx    Heart disease Neg Hx     Social History   Socioeconomic History   Marital status: Single    Spouse name: Not on file   Number of children: Not on file   Years of education: Not on file   Highest education level: Not on file  Occupational History   Not on file  Tobacco Use   Smoking status: Former    Packs/day: .25    Types: Cigarettes    Quit date: 08/06/2015    Years since quitting: 6.9   Smokeless tobacco: Never  Vaping Use   Vaping Use: Some days   Substances: Nicotine, Flavoring  Substance and Sexual Activity   Alcohol use: Yes    Comment: occas   Drug use: No   Sexual activity: Yes    Birth control/protection: Injection  Other Topics Concern   Not on file  Social History Narrative   Not on file   Social Determinants of Health   Financial Resource Strain: Not on file  Food Insecurity: Not on file  Transportation Needs: Not on file  Physical Activity: Not on file  Stress: Not on file  Social Connections: Not on file  Intimate Partner Violence: Not on file    Current Outpatient Medications on File Prior to Visit  Medication Sig Dispense Refill   cetirizine (ZYRTEC ALLERGY) 10 MG tablet Take 1 tablet (10 mg total) by mouth daily. (Patient not taking: Reported on 04/26/2022) 30 tablet 0   medroxyPROGESTERone (DEPO-PROVERA) 150 MG/ML injection Inject 1 mL (150 mg total) into the muscle every 3 (three) months. 1 mL 3   Current Facility-Administered Medications on File Prior to Visit  Medication Dose Route Frequency Provider Last Rate Last Admin   medroxyPROGESTERone (DEPO-PROVERA) injection 150 mg  150 mg Intramuscular Q90 days Hildred Laser, MD   150 mg at 08/17/21 1606    No Known Allergies   Review of Systems Constitutional: negative for chills, fatigue, fevers and sweats Eyes: negative for irritation, redness and visual  disturbance Ears, nose, mouth, throat, and face: negative for hearing loss, nasal congestion, snoring and tinnitus Respiratory: negative for asthma, cough, sputum Cardiovascular: negative for chest pain, dyspnea, exertional chest pressure/discomfort, irregular heart beat, palpitations and syncope Gastrointestinal: negative for abdominal pain, change in bowel habits, nausea and vomiting Genitourinary: negative for abnormal menstrual periods, genital lesions, sexual problems and vaginal discharge, dysuria and urinary incontinence Integument/breast: negative for breast lump, breast tenderness and nipple discharge Hematologic/lymphatic: negative for bleeding and easy bruising Musculoskeletal:negative for back pain and muscle weakness Neurological: negative for dizziness, headaches, vertigo and weakness Endocrine: negative for diabetic symptoms including polydipsia, polyuria and skin dryness Allergic/Immunologic: negative for hay fever and urticaria      Objective:  There were no vitals taken for this visit. There is no height or weight on file to calculate BMI.    General Appearance:    Alert, cooperative, no distress, appears stated age  Head:    Normocephalic, without obvious abnormality, atraumatic  Eyes:    PERRL, conjunctiva/corneas clear, EOM's intact, both eyes  Ears:    Normal external ear canals, both ears  Nose:   Nares normal, septum midline, mucosa normal, no drainage or sinus tenderness  Throat:   Lips, mucosa, and tongue normal; teeth and gums normal  Neck:   Supple, symmetrical, trachea midline, no adenopathy; thyroid: no enlargement/tenderness/nodules; no carotid bruit or JVD  Back:     Symmetric, no curvature, ROM normal, no CVA tenderness  Lungs:     Clear to auscultation bilaterally, respirations unlabored  Chest Wall:    No tenderness or deformity   Heart:    Regular rate and rhythm, S1 and S2 normal, no murmur, rub or gallop  Breast Exam:    No tenderness, masses, or  nipple abnormality  Abdomen:     Soft, non-tender, bowel sounds active all four quadrants, no masses, no organomegaly.    Genitalia:    Pelvic:external genitalia normal, vagina without lesions, discharge, or tenderness, rectovaginal septum  normal. Cervix normal in appearance, no cervical motion tenderness, no adnexal masses or tenderness.  Uterus normal size, shape, mobile, regular contours, nontender.  Rectal:    Normal external sphincter.  No hemorrhoids appreciated. Internal exam not done.   Extremities:   Extremities normal, atraumatic, no cyanosis or edema  Pulses:   2+ and symmetric all extremities  Skin:   Skin color, texture, turgor normal, no rashes or lesions  Lymph nodes:   Cervical, supraclavicular, and axillary nodes normal  Neurologic:   CNII-XII intact, normal strength, sensation and reflexes throughout   .  Labs:  Lab Results  Component Value Date   WBC 7.2 02/03/2021   HGB 15.4 (H)  02/03/2021   HCT 47.0 (H) 02/03/2021   MCV 92.5 02/03/2021   PLT 293 02/03/2021    Lab Results  Component Value Date   CREATININE 0.77 12/19/2018   BUN 18 12/19/2018   NA 140 12/19/2018   K 4.2 12/19/2018   CL 102 12/19/2018   CO2 22 12/19/2018    Lab Results  Component Value Date   ALT 19 12/19/2018   AST 21 12/19/2018   ALKPHOS 83 12/19/2018   BILITOT <0.2 12/19/2018    Lab Results  Component Value Date   TSH 1.400 12/19/2018     Assessment:   1. Well woman exam with routine gynecological exam   2. Dyslipidemia   3. Screening for diabetes mellitus (DM)      Plan:  Blood tests: Ordered for today. Breast self exam technique reviewed and patient encouraged to perform self-exam monthly. Contraception: Depo-Provera injections. Discussed healthy lifestyle modifications and self breast exams. Mammogram  : Not age appropriate Pap smear  UTD . COVID vaccination status: Follow up in 1 year for annual exam  Hildred Laser, MD [facility]

## 2022-07-10 ENCOUNTER — Ambulatory Visit: Payer: BC Managed Care – PPO | Admitting: Obstetrics and Gynecology

## 2022-07-10 DIAGNOSIS — E785 Hyperlipidemia, unspecified: Secondary | ICD-10-CM

## 2022-07-10 DIAGNOSIS — Z131 Encounter for screening for diabetes mellitus: Secondary | ICD-10-CM

## 2022-07-10 DIAGNOSIS — Z01419 Encounter for gynecological examination (general) (routine) without abnormal findings: Secondary | ICD-10-CM

## 2022-07-27 ENCOUNTER — Ambulatory Visit
Admission: EM | Admit: 2022-07-27 | Discharge: 2022-07-27 | Disposition: A | Discharge status: Home / Self Care | Payer: BC Managed Care – PPO | Attending: Family Medicine | Admitting: Family Medicine

## 2022-07-27 DIAGNOSIS — J0140 Acute pansinusitis, unspecified: Secondary | ICD-10-CM

## 2022-07-27 DIAGNOSIS — H938X3 Other specified disorders of ear, bilateral: Secondary | ICD-10-CM | POA: Diagnosis not present

## 2022-07-27 DIAGNOSIS — J029 Acute pharyngitis, unspecified: Secondary | ICD-10-CM

## 2022-07-27 MED ORDER — AZITHROMYCIN 250 MG PO TABS: ORAL_TABLET | ORAL | 0 refills | Status: DC

## 2022-07-27 MED ORDER — FLUTICASONE PROPIONATE 50 MCG/ACT NA SUSP: 1.0000 | Freq: Two times a day (BID) | NASAL | 0 refills | Status: AC | PRN

## 2022-07-27 MED ORDER — CETIRIZINE HCL 10 MG PO TABS: 10.0000 mg | ORAL_TABLET | Freq: Every evening | ORAL | 0 refills | Status: AC | PRN

## 2022-07-27 NOTE — ED Provider Notes (Signed)
[facility]    CSN: [id number] Arrival date & time: 07/27/22  1634      History   Chief Complaint Chief Complaint  Patient presents with   Facial Pain   Nasal Congestion    HPI Darlene Miller is a 32 y.o. female.   HPI SINUSITIS Patient presents with concern for possible sinus infection.  Patient reports she has chronic allergies and constantly has nasal drainage and she is supposed to take allergy medication but reports she does not take medication daily.  Patient states she did take a dose of Claritin this morning.  However has had headache, sore throat, facial pressure which has worsened over the last 24 hours.  She was here in clinic yesterday with her daughter who had an upper respiratory illness.  She has a low-grade fever on arrival.  Denies wheezing, shortness of breath, chest tightness or cough.  Endorses bilateral itchy ears but no overt ear pain.  Remainder of Review of Systems negative except as noted in the HPI.    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   CESAREAN SECTION N/A 06/08/2016   Procedure: CESAREAN SECTION;  Surgeon: Linzie Collin, MD;  Location: [facility] ORS;  Service: Obstetrics;  Laterality: N/A;  Female born @ [birth year] Weight: 5lbs  Apgars: 8/9   NECK SURGERY     mass on neck- benign   ORIF WRIST FRACTURE Left 02/03/2021   Procedure: OPEN REDUCTION INTERNAL FIXATION (ORIF) WRIST FRACTURE;  Surgeon: Gomez Cleverly, MD;  Location: [facility];  Service: Orthopedics;  Laterality: Left;  with MAC anesthesia   TO FOLLOW HIMSELF 1900    OB History     Gravida  1   Para  1   Term  1   Preterm  0   AB  0   Living  1      SAB  0   IAB  0   Ectopic  0   Multiple  0   Live Births  1        Obstetric Comments  G1- IUGR in pregnancy           Home Medications    Prior to Admission medications   Medication Sig Start Date End Date Taking? Authorizing Provider  azithromycin (ZITHROMAX) 250 MG tablet Take 2 tabs PO x 1 dose, then 1 tab PO QD x 4 days 07/27/22  Yes Bing Neighbors, NP  cetirizine (ZYRTEC) 10 MG tablet Take 1 tablet (10 mg total) by mouth at bedtime as needed for allergies. 07/27/22  Yes Bing Neighbors, NP  fluticasone (FLONASE) 50 MCG/ACT nasal spray Place 1 spray into both nostrils 2 (two) times daily as needed for allergies or rhinitis. 07/27/22  Yes Bing Neighbors, NP    Family History Family History  Problem Relation Age of Onset   Hypertension Mother    Restless legs syndrome Mother    COPD Mother    Cancer Neg Hx    Diabetes Neg Hx    Heart disease Neg Hx     Social History Social History   Tobacco Use   Smoking status: Former    Packs/day: .25    Types: Cigarettes    Quit date: 08/06/2015    Years since quitting:  6.9   Smokeless tobacco: Never  Vaping Use   Vaping Use: Some days   Substances: Nicotine, Flavoring  Substance Use Topics   Alcohol use: Yes    Comment: occas   Drug use: No     Allergies   Patient has no known allergies.   Review of Systems Review of Systems Pertinent negatives listed in HPI   Physical Exam Triage Vital Signs ED Triage Vitals  Enc Vitals Group     BP 07/27/22 1710 109/75     Pulse Rate 07/27/22 1710 82     Resp 07/27/22 1710 18     Temp 07/27/22 1710 99.3 F (37.4 C)     Temp Source 07/27/22 1710 Oral     SpO2 07/27/22 1710 97 %     Weight --      Height --      Head Circumference --      Peak Flow --      Pain Score 07/27/22 1711 0     Pain Loc --      Pain Edu? --      Excl. in GC? --    No data found.  Updated Vital Signs BP 109/75 (BP Location: Left Arm)   Pulse 82   Temp 99.3 F (37.4 C) (Oral)   Resp 18   SpO2 97%   Visual Acuity Right Eye Distance:   Left Eye Distance:   Bilateral Distance:    Right  Eye Near:   Left Eye Near:    Bilateral Near:     Physical Exam  General Appearance:    Alert, acute ill appearing, cooperative, no distress  HENT:   Normocephalic, ears normal, nares mucosal edema with congestion, rhinorrhea, oropharynx erythematous with uvula swelling  Eyes:    PERRL, conjunctiva/corneas clear, EOM's intact       Lungs:     Clear to auscultation bilaterally, respirations unlabored  Heart:    Regular rate and rhythm  Neurologic:   Awake, alert, oriented x 3. No apparent focal neurological           defect.      UC Treatments / Results  Labs (all labs ordered are listed, but only abnormal results are displayed) Labs Reviewed - No data to display  EKG   Radiology No results found.  Procedures Procedures (including critical care time)  Medications Ordered in UC Medications - No data to display  Initial Impression / Assessment and Plan / UC Course  I have reviewed the triage vital signs and the nursing notes.  Pertinent labs & imaging results that were available during my care of the patient were reviewed by me and considered in my medical decision making (see chart for details).    Treating empirically for acute nonrecurrent pansinusitis with azithromycin.   Encouraged patient to resume daily Zyrtec and Flonase for allergy symptoms to prevent recurrent sinus actions.  Return precautions given. Final Clinical Impressions(s) / UC Diagnoses   Final diagnoses:  Acute non-recurrent pansinusitis  Irritation of ear, bilateral  Acute pharyngitis, unspecified etiology   Discharge Instructions   None    ED Prescriptions     Medication Sig Dispense Auth. Provider   azithromycin (ZITHROMAX) 250 MG tablet Take 2 tabs PO x 1 dose, then 1 tab PO QD x 4 days 6 tablet Bing Neighbors, NP   fluticasone (FLONASE) 50 MCG/ACT nasal spray Place 1 spray into both nostrils 2 (two) times daily as needed for allergies or rhinitis. 11.1 mL  Bing Neighbors, NP    cetirizine (ZYRTEC) 10 MG tablet Take 1 tablet (10 mg total) by mouth at bedtime as needed for allergies. 30 tablet Bing Neighbors, NP      PDMP not reviewed this encounter.   Bing Neighbors, NP 07/27/22 [phone]

## 2022-07-27 NOTE — ED Triage Notes (Signed)
Patient with c/o sinus pressure, ear pain and sneezing since yesterday.

## 2022-08-02 ENCOUNTER — Ambulatory Visit (INDEPENDENT_AMBULATORY_CARE_PROVIDER_SITE_OTHER): Payer: BC Managed Care – PPO

## 2022-08-02 VITALS — BP 96/80 | HR 70 | Wt 129.5 lb

## 2022-08-02 DIAGNOSIS — Z3202 Encounter for pregnancy test, result negative: Secondary | ICD-10-CM

## 2022-08-02 DIAGNOSIS — Z3042 Encounter for surveillance of injectable contraceptive: Secondary | ICD-10-CM

## 2022-08-02 LAB — POCT URINE PREGNANCY: Preg Test, Ur: NEGATIVE

## 2022-08-02 MED ORDER — MEDROXYPROGESTERONE ACETATE 150 MG/ML IM SUSP
150.0000 mg | Freq: Once | INTRAMUSCULAR | Status: AC
Start: 2022-08-02 — End: 2022-08-02
  Administered 2022-08-02: 150 mg via INTRAMUSCULAR

## 2022-08-02 NOTE — Patient Instructions (Signed)

## 2022-08-02 NOTE — Progress Notes (Signed)
    NURSE VISIT NOTE  Subjective:    Patient ID: Darlene Miller, female    DOB: 04/06/1990, 32 y.o.   MRN: [id number]  HPI  Patient is a 32 y.o. G30P1001 female who presents for depo provera injection.   Objective:    BP 96/80   Pulse 70   Wt 129 lb 8 oz (58.7 kg)   BMI 25.29 kg/m   Last Annual: 12/27/2020. Last pap: 12/23/2019. Last Depo-Provera: 04/26/22. Side Effects if any: none. Serum HCG indicated? No .Will order POCT Urine HCG, patient was suppose to return back between the dates of 6/6-6/20/2024 Depo-Provera 150 mg IM given by: Sheliah Hatch, CMA. Site: Left Ventrogluteal  Lab Review    Assessment:   1. Encounter for management and injection of depo-Provera      Plan:   Next appointment due between 9/12 and 11/01/2022.    Fonda Kinder, CMA

## 2022-09-04 ENCOUNTER — Ambulatory Visit: Payer: BC Managed Care – PPO | Admitting: Obstetrics and Gynecology

## 2022-09-04 NOTE — Progress Notes (Deleted)
PCP:  Patient, No Pcp Per   No chief complaint on file.    HPI:      Darlene Miller is a 32 y.o. G1P1001 whose LMP was No LMP recorded. Patient has had an injection., presents today for her annual examination.  Her menses are absent with depo.   Sex activity: {sex active: 315163}.  Last Pap: 12/23/19  Results were: no abnormalities;  Reports h/o abnormal pap smear in 2017 (LGSIL, followed by colposcopy, normal biopsy).  Hx of STDs: HSV 2  There is no FH of breast cancer. There is no FH of ovarian cancer. The patient {does:18564} do self-breast exams.  Tobacco use: {tob:20664} Alcohol use: {Alcohol:11675} No drug use.  Exercise: {exercise:31265}  She {does:18564} get adequate calcium and Vitamin D in her diet.  Patient Active Problem List   Diagnosis Date Noted   Encounter for management and injection of depo-Provera 02/16/2019   Tobacco use disorder, moderate, in early remission 02/07/2016   HSV-2 (herpes simplex virus 2) infection 02/07/2016   Anxiety disorder 02/07/2016   Marijuana use, episodic 01/08/2016   Papanicolaou smear of cervix with low grade squamous intraepithelial lesion (LGSIL) 02/23/2015    Past Surgical History:  Procedure Laterality Date   [place] Butt LIft  2021   Had surgery [place], [place].   CESAREAN SECTION N/A 06/08/2016   Procedure: CESAREAN SECTION;  Surgeon: Linzie Collin, MD;  Location: [facility] ORS;  Service: Obstetrics;  Laterality: N/A;  Female born @ [birth year] Weight: 5lbs  Apgars: 8/9   NECK SURGERY     mass on neck- benign   ORIF WRIST FRACTURE Left 02/03/2021   Procedure: OPEN REDUCTION INTERNAL FIXATION (ORIF) WRIST FRACTURE;  Surgeon: Gomez Cleverly, MD;  Location: [facility];  Service: Orthopedics;  Laterality: Left;  with MAC anesthesia   TO FOLLOW HIMSELF 1900    Family History  Problem Relation Age of Onset   Hypertension Mother    Restless legs syndrome Mother    COPD Mother    Cancer Neg Hx    Diabetes Neg Hx    Heart  disease Neg Hx     Social History   Socioeconomic History   Marital status: Single    Spouse name: Not on file   Number of children: Not on file   Years of education: Not on file   Highest education level: Not on file  Occupational History   Not on file  Tobacco Use   Smoking status: Former    Current packs/day: 0.00    Types: Cigarettes    Quit date: 08/06/2015    Years since quitting: 7.0   Smokeless tobacco: Never  Vaping Use   Vaping status: Some Days   Substances: Nicotine, Flavoring  Substance and Sexual Activity   Alcohol use: Yes    Comment: occas   Drug use: No   Sexual activity: Yes    Birth control/protection: Injection  Other Topics Concern   Not on file  Social History Narrative   Not on file   Social Determinants of Health   Financial Resource Strain: Not on file  Food Insecurity: Not on file  Transportation Needs: Not on file  Physical Activity: Not on file  Stress: Not on file  Social Connections: Unknown (06/20/2021)   [facility]   Social Network    Social Network: Not on file  Intimate Partner Violence: Unknown (05/12/2021)   Received from [facility]   HITS    Physically Hurt: Not on  file    Insult or Talk Down To: Not on file    Threaten Physical Harm: Not on file    Scream or Curse: Not on file     Current Outpatient Medications:    cetirizine (ZYRTEC) 10 MG tablet, Take 1 tablet (10 mg total) by mouth at bedtime as needed for allergies., Disp: 30 tablet, Rfl: 0   fluticasone (FLONASE) 50 MCG/ACT nasal spray, Place 1 spray into both nostrils 2 (two) times daily as needed for allergies or rhinitis., Disp: 11.1 mL, Rfl: 0   hydrOXYzine (ATARAX) 25 MG tablet, , Disp: , Rfl:      ROS:  Review of Systems BREAST: No symptoms   Objective: There were no vitals taken for this visit.   OBGyn Exam  Results: No results found for this or any previous visit (from the past 24 hour(s)).  Assessment/Plan: No diagnosis  found.  No orders of the defined types were placed in this encounter.            GYN counsel {counseling: 16159}     F/U  No follow-ups on file.   B. , PA-C 09/04/2022 12:42 PM

## 2022-10-19 ENCOUNTER — Ambulatory Visit (INDEPENDENT_AMBULATORY_CARE_PROVIDER_SITE_OTHER): Payer: BC Managed Care – PPO

## 2022-10-19 VITALS — BP 116/76 | HR 87 | Ht 60.0 in | Wt 126.0 lb

## 2022-10-19 DIAGNOSIS — Z3042 Encounter for surveillance of injectable contraceptive: Secondary | ICD-10-CM

## 2022-10-19 MED ORDER — MEDROXYPROGESTERONE ACETATE 150 MG/ML IM SUSP
150.0000 mg | Freq: Once | INTRAMUSCULAR | Status: AC
Start: 2022-10-19 — End: 2022-10-19
  Administered 2022-10-19: 150 mg via INTRAMUSCULAR

## 2022-10-19 NOTE — Progress Notes (Signed)
NURSE VISIT NOTE  Subjective:    Patient ID: Darlene Miller, female    DOB: [birth year], 32 y.o.   MRN: [id number]  HPI  Patient is a 32 y.o. G67P1001 female who presents for depo provera injection.   Objective:    BP 116/76   Pulse 87   Ht 5' (1.524 m)   Wt 126 lb (57.2 kg)   BMI 24.61 kg/m   Last Annual: 12/27/20. Last pap: 12/23/19. Last Depo-Provera: 08/02/22. Side Effects if any: n/a. Serum HCG indicated? N/a. Depo-Provera 150 mg IM given by: Georgiana Shore, CMA. Site: Left Deltoid    Assessment:   1. Encounter for management and injection of depo-Provera      Plan:   Next appointment due between 01/04/23 and 01/18/23. Needs to schedule annual.     Loman Chroman, CMA

## 2022-11-07 ENCOUNTER — Ambulatory Visit: Payer: BC Managed Care – PPO | Admitting: Obstetrics and Gynecology

## 2023-01-10 ENCOUNTER — Ambulatory Visit: Payer: BC Managed Care – PPO

## 2023-01-10 VITALS — BP 120/79 | HR 81 | Ht 60.0 in | Wt 126.0 lb

## 2023-01-10 DIAGNOSIS — Z3042 Encounter for surveillance of injectable contraceptive: Secondary | ICD-10-CM | POA: Diagnosis not present

## 2023-01-10 MED ORDER — MEDROXYPROGESTERONE ACETATE 150 MG/ML IM SUSY
150.0000 mg | PREFILLED_SYRINGE | Freq: Once | INTRAMUSCULAR | Status: AC
  Administered 2023-01-10: 150 mg via INTRAMUSCULAR

## 2023-01-10 NOTE — Progress Notes (Signed)
    NURSE VISIT NOTE  Subjective:    Patient ID: Darlene Miller, female    DOB: October 27, 1990, 32 y.o.   MRN: [id number]  HPI  Patient is a 32 y.o. G58P1001 female who presents for depo provera injection.   Objective:    Ht 5' (1.524 m)   Wt 126 lb (57.2 kg)   BMI 24.61 kg/m   Last Annual: 12/27/20. Last pap: 12/23/19. Last Depo-Provera: 10/19/2022. Side Effects if any: none. Serum HCG indicated? No . Depo-Provera 150 mg IM given by: Beverely Pace, CMA. Site: Right Deltoid    Assessment:   1. Encounter for management and injection of depo-Provera      Plan:   Next appointment due between FEB 20-March 6     Loney Laurence, CMA

## 2023-01-11 ENCOUNTER — Ambulatory Visit: Payer: BC Managed Care – PPO

## 2023-02-20 ENCOUNTER — Encounter: Payer: Self-pay | Admitting: Obstetrics and Gynecology

## 2023-02-20 ENCOUNTER — Other Ambulatory Visit (HOSPITAL_COMMUNITY)
Admission: RE | Admit: 2023-02-20 | Discharge: 2023-02-20 | Disposition: A | Discharge status: Home / Self Care | Payer: BC Managed Care – PPO | Source: Ambulatory Visit | Attending: Obstetrics and Gynecology | Admitting: Obstetrics and Gynecology

## 2023-02-20 ENCOUNTER — Ambulatory Visit (INDEPENDENT_AMBULATORY_CARE_PROVIDER_SITE_OTHER): Payer: BC Managed Care – PPO | Admitting: Obstetrics and Gynecology

## 2023-02-20 VITALS — BP 108/77 | HR 73 | Resp 16 | Ht 60.0 in | Wt 127.7 lb

## 2023-02-20 DIAGNOSIS — Z124 Encounter for screening for malignant neoplasm of cervix: Secondary | ICD-10-CM

## 2023-02-20 DIAGNOSIS — Z23 Encounter for immunization: Secondary | ICD-10-CM | POA: Diagnosis not present

## 2023-02-20 DIAGNOSIS — N6459 Other signs and symptoms in breast: Secondary | ICD-10-CM

## 2023-02-20 DIAGNOSIS — Z01419 Encounter for gynecological examination (general) (routine) without abnormal findings: Secondary | ICD-10-CM | POA: Insufficient documentation

## 2023-02-20 DIAGNOSIS — Z1159 Encounter for screening for other viral diseases: Secondary | ICD-10-CM

## 2023-02-20 DIAGNOSIS — Z3042 Encounter for surveillance of injectable contraceptive: Secondary | ICD-10-CM

## 2023-02-20 NOTE — Patient Instructions (Signed)
Influenza (Flu) Vaccine (Inactivated or Recombinant): What You Need to Know Many vaccine information statements are available in Spanish and other languages. See PromoAge.com.br. 1. Why get vaccinated? Influenza vaccine can prevent influenza (flu). Flu is a contagious disease that spreads around the [place] every year, usually between October and May. Anyone can get the flu, but it is more dangerous for some people. Infants and young children, people 58 years and older, pregnant people, and people with certain health conditions or a weakened immune system are at greatest risk of flu complications. Pneumonia, bronchitis, sinus infections, and ear infections are examples of flu-related complications. If you have a medical condition, such as heart disease, cancer, or diabetes, flu can make it worse. Flu can cause fever and chills, sore throat, muscle aches, fatigue, cough, headache, and runny or stuffy nose. Some people may have vomiting and diarrhea, though this is more common in children than adults. In an average year, thousands of people in the [place] die from flu, and many more are hospitalized. Flu vaccine prevents millions of illnesses and flu-related visits to the doctor each year. 2. Influenza vaccines CDC recommends everyone 6 months and older get vaccinated every flu season. Children 6 months through 51 years of age may need 2 doses during a single flu season. Everyone else needs only 1 dose each flu season. It takes about 2 weeks for protection to develop after vaccination. There are many flu viruses, and they are always changing. Each year a new flu vaccine is made to protect against the influenza viruses believed to be likely to cause disease in the upcoming flu season. Even when the vaccine doesn't exactly match these viruses, it may still provide some protection. Influenza vaccine does not cause flu. Influenza vaccine may be given at the same time as other vaccines. 3.  Talk with your health care provider Tell your vaccination provider if the person getting the vaccine: Has had an allergic reaction after a previous dose of influenza vaccine, or has any severe, life-threatening allergies Has ever had Guillain-Barr Syndrome (also called "GBS") In some cases, your health care provider may decide to postpone influenza vaccination until a future visit. Influenza vaccine can be administered at any time during pregnancy. People who are or will be pregnant during influenza season should receive inactivated influenza vaccine. People with minor illnesses, such as a cold, may be vaccinated. People who are moderately or severely ill should usually wait until they recover before getting influenza vaccine. Your health care provider can give you more information. 4. Risks of a vaccine reaction Soreness, redness, and swelling where the shot is given, fever, muscle aches, and headache can happen after influenza vaccination. There may be a very small increased risk of Guillain-Barr Syndrome (GBS) after inactivated influenza vaccine (the flu shot). Young children who get the flu shot along with pneumococcal vaccine (PCV13) and/or DTaP vaccine at the same time might be slightly more likely to have a seizure caused by fever. Tell your health care provider if a child who is getting flu vaccine has ever had a seizure. People sometimes faint after medical procedures, including vaccination. Tell your provider if you feel dizzy or have vision changes or ringing in the ears. As with any medicine, there is a very remote chance of a vaccine causing a severe allergic reaction, other serious injury, or death. 5. What if there is a serious problem? An allergic reaction could occur after the vaccinated person leaves the clinic. If you see signs of  a severe allergic reaction (hives, swelling of the face and throat, difficulty breathing, a fast heartbeat, dizziness, or weakness), call 9-1-1 and get  the person to the nearest hospital. For other signs that concern you, call your health care provider. Adverse reactions should be reported to the Vaccine Adverse Event Reporting System (VAERS). Your health care provider will usually file this report, or you can do it yourself. Visit the VAERS website at www.vaers.LAgents.no or call (774)365-3656. VAERS is only for reporting reactions, and VAERS staff members do not give medical advice. 6. The National Vaccine Injury Compensation Program The [employer] Vaccine Injury Compensation Program (VICP) is a federal program that was created to compensate people who may have been injured by certain vaccines. Claims regarding alleged injury or death due to vaccination have a time limit for filing, which may be as short as two years. Visit the VICP website at SpiritualWord.at or call 3187036210 to learn about the program and about filing a claim. 7. How can I learn more? Ask your health care provider. Call your local or state health department. Visit the website of the Food and Drug Administration (FDA) for vaccine package inserts and additional information at FinderList.no. Contact the Centers for Disease Control and Prevention (CDC): Call 787-694-5930 (1-800-CDC-INFO) or Visit CDC's website at BiotechRoom.com.cy. Source: CDC Vaccine Information Statement Inactivated Influenza Vaccine (09/11/2019) This same material is available at FootballExhibition.com.br for no charge. This information is not intended to replace advice given to you by your health care provider. Make sure you discuss any questions you have with your health care provider. Document Revised: 05/09/2022 Document Reviewed: 02/12/2022 Elsevier Patient Education  2024 [employer].

## 2023-02-20 NOTE — Progress Notes (Signed)
 GYNECOLOGY ANNUAL PHYSICAL EXAM PROGRESS NOTE  Subjective:    Darlene Miller is a 33 y.o. G66P1001 female who presents for an annual exam.  The patient is sexually active. The patient participates in regular exercise: no. Has the patient ever been transfused or tattooed?: no. The patient reports that there is not domestic violence in her life.   The patient has the following complaints today: Notes sometimes she has itching and an odor coming from her nipples.  Does not get red or painful. Sometimes has a slight discharge.   Menstrual History: Menarche age: 14-11 No LMP recorded. Patient has had an injection.     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 Results were: normal.   Reports h/o abnormal pap smear in 2017 (LGSIL, followed by colposcopy, normal biopsy).  Last mammogram: Not age appropriate.     Upstream - 02/20/23 1412       Pregnancy Intention Screening   Does the patient want to become pregnant in the next year? No    Does the patient's partner want to become pregnant in the next year? No    Would the patient like to discuss contraceptive options today? No      Contraception Wrap Up   Current Method Hormonal Injection    End Method Hormonal Injection    Contraception Counseling Provided No    How was the end contraceptive method provided? N/A            The pregnancy intention screening data noted above was reviewed. Potential methods of contraception were discussed. The patient elected to proceed with Hormonal Injection.   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   CESAREAN SECTION N/A 06/08/2016   Procedure: CESAREAN SECTION;  Surgeon: Zenobia Hila, MD;  Location: [facility] ORS;  Service: Obstetrics;  Laterality: N/A;  Female born @ [birth year]41 Weight: 5lbs  Apgars: 8/9   NECK SURGERY     mass on neck- benign   ORIF WRIST FRACTURE Left 02/03/2021   Procedure: OPEN REDUCTION INTERNAL FIXATION (ORIF) WRIST FRACTURE;  Surgeon: Ltanya Rummer, MD;  Location: [facility];  Service: Orthopedics;  Laterality: Left;  with MAC anesthesia   TO FOLLOW HIMSELF 1900    Family History  Problem Relation Age of Onset   Hypertension Mother    Restless legs syndrome Mother    COPD Mother    Cancer Neg Hx    Diabetes Neg Hx    Heart disease Neg Hx     Social History   Socioeconomic History   Marital status: Single    Spouse name: Not on file   Number of children: Not on file   Years of education: Not on file   Highest education level: Not on file  Occupational History   Not on file  Tobacco Use   Smoking status: Former    Current packs/day: 0.00    Types: Cigarettes    Quit date: 08/06/2015    Years since quitting: 7.5   Smokeless tobacco: Never  Vaping Use   Vaping status: Some Days   Substances: Nicotine, Flavoring  Substance and Sexual Activity   Alcohol use: Yes    Comment: occas   Drug use: No   Sexual activity: Yes    Birth control/protection: Injection  Other Topics Concern   Not on file  Social History Narrative   Not on file   Social Drivers of Health   Financial Resource Strain: Not on file  Food Insecurity: Not on file  Transportation Needs: Not on file  Physical Activity: Not on file  Stress: Not on file  Social Connections: Unknown (06/20/2021)   [facility], [facility]   Social Network    Social Network: Not on file  Intimate  Partner Violence: Unknown (05/12/2021)   Received from [facility], [facility]   HITS    Physically Hurt: Not on file    Insult or Talk Down To: Not on file    Threaten Physical Harm: Not on file    Scream or Curse: Not on file    Current Outpatient Medications on File Prior to Visit  Medication Sig Dispense Refill   cetirizine  (ZYRTEC ) 10 MG tablet Take 1 tablet (10 mg total) by mouth at bedtime as needed for allergies. 30 tablet 0   fluticasone  (FLONASE ) 50 MCG/ACT nasal spray Place 1 spray into both nostrils 2 (two) times daily as needed for allergies or rhinitis. 11.1 mL 0   medroxyPROGESTERone  (DEPO-PROVERA ) 150 MG/ML injection Inject 150 mg into the muscle every 3 (three) months.     No current facility-administered medications on file prior to visit.    No Known Allergies   Review of Systems Constitutional: negative for chills, fatigue, fevers and sweats Eyes: negative for irritation, redness and visual disturbance Ears, nose, mouth, throat, and face: negative for hearing loss, nasal congestion, snoring and tinnitus Respiratory: negative for asthma, cough, sputum Cardiovascular: negative for chest pain, dyspnea, exertional chest pressure/discomfort, irregular heart beat, palpitations and syncope Gastrointestinal: negative for abdominal pain, change in bowel habits, nausea and vomiting Genitourinary: negative for abnormal menstrual periods, genital lesions, sexual problems and vaginal discharge, dysuria and urinary incontinence Integument/breast: negative for breast lump, breast tenderness and nipple discharge Hematologic/lymphatic: negative for bleeding and easy bruising Musculoskeletal:negative for back pain and muscle weakness Neurological: negative for dizziness, headaches, vertigo and weakness Endocrine: negative for diabetic symptoms including polydipsia, polyuria and skin dryness Allergic/Immunologic: negative for hay fever and urticaria      Objective:  Blood  pressure 108/77, pulse 73, resp. rate 16, height 5' (1.524 m), weight 127 lb 11.2 oz (57.9 kg).  Body mass index is 24.94 kg/m.    General Appearance:    Alert, cooperative, no distress, appears stated age  Head:    Normocephalic, without obvious abnormality, atraumatic  Eyes:    PERRL, conjunctiva/corneas clear, EOM's intact, both eyes  Ears:    Normal external ear canals, both ears  Nose:   Nares normal, septum midline, mucosa normal, no drainage or sinus tenderness  Throat:   Lips, mucosa, and tongue normal; teeth and gums normal  Neck:   Supple, symmetrical, trachea midline, no adenopathy; thyroid: no enlargement/tenderness/nodules; no carotid bruit or JVD  Back:     Symmetric, no curvature,  ROM normal, no CVA tenderness  Lungs:     Clear to auscultation bilaterally, respirations unlabored  Chest Wall:    No tenderness or deformity   Heart:    Regular rate and rhythm, S1 and S2 normal, no murmur, rub or gallop  Breast Exam:    No tenderness, masses, or nipple abnormality  Abdomen:     Soft, non-tender, bowel sounds active all four quadrants, no masses, no organomegaly.    Genitalia:    Pelvic:external genitalia normal, vagina without lesions, discharge, or tenderness, rectovaginal septum  normal. Cervix normal in appearance, no cervical motion tenderness, no adnexal masses or tenderness.  Uterus normal size, shape, mobile, regular contours, nontender.  Rectal:    Normal external sphincter.  No hemorrhoids appreciated. Internal exam not done.   Extremities:   Extremities normal, atraumatic, no cyanosis or edema  Pulses:   2+ and symmetric all extremities  Skin:   Skin color, texture, turgor normal, no rashes or lesions  Lymph nodes:   Cervical, supraclavicular, and axillary nodes normal  Neurologic:   CNII-XII intact, normal strength, sensation and reflexes throughout   .  Labs:  Lab Results  Component Value Date   WBC 7.2 02/03/2021   HGB 15.4 (H) 02/03/2021   HCT 47.0 (H)  02/03/2021   MCV 92.5 02/03/2021   PLT 293 02/03/2021    Lab Results  Component Value Date   CREATININE 0.77 12/19/2018   BUN 18 12/19/2018   NA 140 12/19/2018   K 4.2 12/19/2018   CL 102 12/19/2018   CO2 22 12/19/2018    Lab Results  Component Value Date   ALT 19 12/19/2018   AST 21 12/19/2018   ALKPHOS 83 12/19/2018   BILITOT <0.2 12/19/2018    Lab Results  Component Value Date   TSH 1.400 12/19/2018     Assessment:   1. Encounter for well woman exam with routine gynecological exam   2. Need for hepatitis C screening test   3. Cervical cancer screening   4. Encounter for immunization   5. Surveillance for Depo-Provera  contraception   6. Nipple problem      Plan:  - Blood tests: see orders.  Advised on 1 time low risk screening for Hep C, ok to perform.  - Breast self exam technique reviewed and patient encouraged to perform self-exam monthly. - Contraception: Depo-Provera  injections.  Patient has been on injections for ~ 6 years. Discussed - bone density scanning to assess for reversible bone loss, or transitioning to a different contraceptive. Patient ok to perform bone density scan. Continue q 3 month injections if no bone loss noted. Encourage a multivitamin with D and calcium .  - Discussed healthy lifestyle modifications. - Mammogram  : Not age appropriate - Pap smear ordered. - Flu vaccine: administered today.  - Discussed OTC remedies for nipple issues. No obvious infection or drainage noted today. Advised on use of tea tree or coconut oil to the nipples for antibacterial/anti-fungal properties and moisturizer.  - Follow up in 1 year for annual exam   Teresa Fender, MD [facility]

## 2023-02-21 LAB — COMPREHENSIVE METABOLIC PANEL
ALT: 26 [IU]/L (ref 0–32)
AST: 26 [IU]/L (ref 0–40)
Albumin: 4.5 g/dL (ref 3.9–4.9)
Alkaline Phosphatase: 95 [IU]/L (ref 44–121)
BUN/Creatinine Ratio: 19 (ref 9–23)
BUN: 14 mg/dL (ref 6–20)
Bilirubin Total: <0.2 mg/dL (ref 0.0–1.2)
CO2: 21 mmol/L (ref 20–29)
Calcium: 9.7 mg/dL (ref 8.7–10.2)
Chloride: 105 mmol/L (ref 96–106)
Creatinine, Ser: 0.73 mg/dL (ref 0.57–1.00)
Globulin, Total: 2.5 g/dL (ref 1.5–4.5)
Glucose: 76 mg/dL (ref 70–99)
Potassium: 4.8 mmol/L (ref 3.5–5.2)
Sodium: 144 mmol/L (ref 134–144)
Total Protein: 7 g/dL (ref 6.0–8.5)
eGFR: 111 mL/min/{1.73_m2} (ref >59)

## 2023-02-21 LAB — TSH: TSH: 1.18 u[IU]/mL (ref 0.450–4.500)

## 2023-02-21 LAB — CBC
Hematocrit: 43.7 % (ref 34.0–46.6)
Hemoglobin: 14.2 g/dL (ref 11.1–15.9)
MCH: 29.2 pg (ref 26.6–33.0)
MCHC: 32.5 g/dL (ref 31.5–35.7)
MCV: 90 fL (ref 79–97)
Platelets: 362 10*3/uL (ref 150–450)
RBC: 4.87 x10E6/uL (ref 3.77–5.28)
RDW: 13.8 % (ref 11.7–15.4)
WBC: 7.3 10*3/uL (ref 3.4–10.8)

## 2023-02-21 LAB — HEPATITIS C ANTIBODY: Hep C Virus Ab: NONREACTIVE

## 2023-02-24 ENCOUNTER — Encounter: Payer: Self-pay | Admitting: Obstetrics and Gynecology

## 2023-03-11 LAB — CYTOLOGY - PAP
Comment: NEGATIVE
Comment: NEGATIVE
Comment: NEGATIVE
Diagnosis: NEGATIVE
Diagnosis: REACTIVE
HPV 16: NEGATIVE
HPV 18 / 45: NEGATIVE
High risk HPV: POSITIVE — AB

## 2023-04-02 ENCOUNTER — Other Ambulatory Visit: Payer: Self-pay

## 2023-04-02 ENCOUNTER — Encounter: Payer: Self-pay | Admitting: Emergency Medicine

## 2023-04-02 ENCOUNTER — Ambulatory Visit
Admission: EM | Admit: 2023-04-02 | Discharge: 2023-04-02 | Disposition: A | Discharge status: Home / Self Care | Payer: BC Managed Care – PPO | Attending: Family Medicine | Admitting: Family Medicine

## 2023-04-02 DIAGNOSIS — K122 Cellulitis and abscess of mouth: Secondary | ICD-10-CM | POA: Diagnosis not present

## 2023-04-02 MED ORDER — FLUCONAZOLE 150 MG PO TABS: 150.0000 mg | ORAL_TABLET | ORAL | 0 refills | Status: AC

## 2023-04-02 MED ORDER — LIDOCAINE VISCOUS HCL 2 % MT SOLN: 5.0000 mL | Freq: Four times a day (QID) | OROMUCOSAL | 0 refills | Status: AC | PRN

## 2023-04-02 MED ORDER — AMOXICILLIN-POT CLAVULANATE 875-125 MG PO TABS
1.0000 | ORAL_TABLET | Freq: Two times a day (BID) | ORAL | 0 refills | Status: AC
Start: 2023-04-02 — End: 2023-04-09

## 2023-04-02 NOTE — Discharge Instructions (Signed)
 Take amoxicillin-clavulanate 875 mg--1 tab twice daily with food for 7 days  Take fluconazole 150 mg--1 tablet every 3 days for 2 doses  Magic mouthwash--- swish and spit 5 mL 4 times daily as needed for mouth pain.  You can use the QR code/website at the back of the summary paperwork to schedule yourself a new patient appointment with primary care

## 2023-04-02 NOTE — ED Triage Notes (Signed)
 Pt here for mouth lesions and irritation x 3 days; pt sts did eat bag of dill pickle pork rinds before noticing; pt sts tongue is painful; no swelling or distress noted

## 2023-04-02 NOTE — ED Provider Notes (Signed)
 [facility]    CSN: [id number] Arrival date & time: 04/02/23  1647      History   Chief Complaint Chief Complaint  Patient presents with   Mouth Lesions    HPI Darlene Miller is a 33 y.o. female.    Mouth Lesions Here with soreness and irritation in her lips and mouth and tongue.  Her lips and tongue have also felt swollen.  No fever or cough or congestion.  No nausea or vomiting or abdominal pain.  NKDA  She is on the Depo-Provera injection.  No known exposures to anyone with similar symptoms.  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   CESAREAN SECTION N/A 06/08/2016   Procedure: CESAREAN SECTION;  Surgeon: Linzie Collin, MD;  Location: [facility] ORS;  Service: Obstetrics;  Laterality: N/A;  Female born @ [birth year] Weight: 5lbs  Apgars: 8/9   NECK SURGERY     mass on neck- benign   ORIF WRIST FRACTURE Left 02/03/2021   Procedure: OPEN REDUCTION INTERNAL FIXATION (ORIF) WRIST FRACTURE;  Surgeon: Gomez Cleverly, MD;  Location: [facility];  Service: Orthopedics;  Laterality: Left;  with MAC anesthesia   TO FOLLOW HIMSELF 1900    OB History     Gravida  1   Para  1   Term  1   Preterm  0   AB  0   Living  1      SAB  0   IAB  0   Ectopic  0   Multiple  0   Live Births  1        Obstetric Comments  G1- IUGR in pregnancy          Home  Medications    Prior to Admission medications   Medication Sig Start Date End Date Taking? Authorizing Provider  amoxicillin-clavulanate (AUGMENTIN) 875-125 MG tablet Take 1 tablet by mouth 2 (two) times daily for 7 days. 04/02/23 04/09/23 Yes Zenia Resides, MD  fluconazole (DIFLUCAN) 150 MG tablet Take 1 tablet (150 mg total) by mouth every 3 (three) days for 2 doses. 04/02/23 04/06/23 Yes Zenia Resides, MD  magic mouthwash (lidocaine, diphenhydrAMINE, alum & mag hydroxide) suspension Swish and spit 5 mLs 4 (four) times daily as needed for mouth pain. 04/02/23  Yes Zenia Resides, MD  cetirizine (ZYRTEC) 10 MG tablet Take 1 tablet (10 mg total) by mouth at bedtime as needed for allergies. 07/27/22   Bing Neighbors, NP  fluticasone (FLONASE) 50 MCG/ACT nasal spray Place 1 spray into both nostrils 2 (two) times daily as needed for allergies or rhinitis. 07/27/22   Bing Neighbors, NP  medroxyPROGESTERone (DEPO-PROVERA) 150 MG/ML injection Inject 150 mg into the muscle every 3 (three) months.    [provider]    Family History Family History  Problem Relation Age of Onset   Hypertension  Mother    Restless legs syndrome Mother    COPD Mother    Cancer Neg Hx    Diabetes Neg Hx    Heart disease Neg Hx     Social History Social History   Tobacco Use   Smoking status: Former    Current packs/day: 0.00    Types: Cigarettes    Quit date: 08/06/2015    Years since quitting: 7.6   Smokeless tobacco: Never  Vaping Use   Vaping status: Some Days   Substances: Nicotine, Flavoring  Substance Use Topics   Alcohol use: Yes    Comment: occas   Drug use: No     Allergies   Patient has no known allergies.   Review of Systems Review of Systems  HENT:  Positive for mouth sores.      Physical Exam Triage Vital Signs ED Triage Vitals  Encounter Vitals Group     BP 04/02/23 1700 121/78     Systolic BP Percentile --      Diastolic BP Percentile --      Pulse  Rate 04/02/23 1700 80     Resp 04/02/23 1700 18     Temp 04/02/23 1700 98.6 F (37 C)     Temp Source 04/02/23 1700 Oral     SpO2 04/02/23 1700 100 %     Weight --      Height --      Head Circumference --      Peak Flow --      Pain Score 04/02/23 1701 3     Pain Loc --      Pain Education --      Exclude from Growth Chart --    No data found.  Updated Vital Signs BP 121/78 (BP Location: Left Arm)   Pulse 80   Temp 98.6 F (37 C) (Oral)   Resp 18   SpO2 100%   Visual Acuity Right Eye Distance:   Left Eye Distance:   Bilateral Distance:    Right Eye Near:   Left Eye Near:    Bilateral Near:     Physical Exam Vitals reviewed.  Constitutional:      General: She is not in acute distress.    Appearance: She is not toxic-appearing or diaphoretic.  HENT:     Nose: Nose normal.     Mouth/Throat:     Mouth: Mucous membranes are moist.     Comments: Oropharynx is benign.  There is a little puffiness to both upper and lower lips in the erythema and irritation is almost linear like it is where her teeth are indenting the skin.  I do not see any ulcerations that would be consistent with a viral illness.  There is an area of slight erythema on the bottom of her right tongue with a little papillary projection.  There are no white patches but there is some swelling noted of both buccal mucosa Cardiovascular:     Rate and Rhythm: Normal rate and regular rhythm.  Pulmonary:     Effort: Pulmonary effort is normal.     Breath sounds: Normal breath sounds.  Neurological:     Mental Status: She is alert.      UC Treatments / Results  Labs (all labs ordered are listed, but only abnormal results are displayed) Labs Reviewed - No data to display  EKG   Radiology No results found.  Procedures Procedures (including critical care time)  Medications Ordered in UC Medications - No  data to display  Initial Impression / Assessment and Plan / UC Course  I have reviewed the  triage vital signs and the nursing notes.  Pertinent labs & imaging results that were available during my care of the patient were reviewed by me and considered in my medical decision making (see chart for details).     I am unsure what is causing these symptoms for her.  She did note eating some pork rinds that were dill pickle flavored right before this started, but this is been going on for about 3 days  Augmentin is sent in for possible cellulitis since her lips are swollen.  2 doses of Diflucan are sent in and Magic mouthwash is also sent in for pain relief.   she worsens she is to go to the emergency room  Final Clinical Impressions(s) / UC Diagnoses   Final diagnoses:  Oral infection     Discharge Instructions      Take amoxicillin-clavulanate 875 mg--1 tab twice daily with food for 7 days  Take fluconazole 150 mg--1 tablet every 3 days for 2 doses  Magic mouthwash--- swish and spit 5 mL 4 times daily as needed for mouth pain.  You can use the QR code/website at the back of the summary paperwork to schedule yourself a new patient appointment with primary care      ED Prescriptions     Medication Sig Dispense Auth. Provider   amoxicillin-clavulanate (AUGMENTIN) 875-125 MG tablet Take 1 tablet by mouth 2 (two) times daily for 7 days. 14 tablet Quincey Quesinberry, Janace Aris, MD   fluconazole (DIFLUCAN) 150 MG tablet Take 1 tablet (150 mg total) by mouth every 3 (three) days for 2 doses. 2 tablet Zenia Resides, MD   magic mouthwash (lidocaine, diphenhydrAMINE, alum & mag hydroxide) suspension Swish and spit 5 mLs 4 (four) times daily as needed for mouth pain. 360 mL Zenia Resides, MD      PDMP not reviewed this encounter.   Zenia Resides, MD 04/02/23 [phone]

## 2023-04-04 ENCOUNTER — Emergency Department (HOSPITAL_COMMUNITY)
Admission: EM | Admit: 2023-04-04 | Discharge: 2023-04-04 | Disposition: A | Discharge status: Home / Self Care | Payer: BC Managed Care – PPO | Attending: Emergency Medicine | Admitting: Emergency Medicine

## 2023-04-04 ENCOUNTER — Other Ambulatory Visit: Payer: Self-pay

## 2023-04-04 ENCOUNTER — Encounter (HOSPITAL_COMMUNITY): Payer: Self-pay

## 2023-04-04 ENCOUNTER — Ambulatory Visit: Payer: BC Managed Care – PPO

## 2023-04-04 DIAGNOSIS — Z87891 Personal history of nicotine dependence: Secondary | ICD-10-CM | POA: Insufficient documentation

## 2023-04-04 DIAGNOSIS — K1379 Other lesions of oral mucosa: Secondary | ICD-10-CM | POA: Diagnosis present

## 2023-04-04 MED ORDER — LIDOCAINE VISCOUS HCL 2 % MT SOLN
15.0000 mL | Freq: Once | OROMUCOSAL | Status: AC
  Administered 2023-04-04: 15 mL via OROMUCOSAL
  Filled 2023-04-04: qty 15

## 2023-04-04 MED ORDER — LIDOCAINE VISCOUS HCL 2 % MT SOLN: 15.0000 mL | Freq: Four times a day (QID) | OROMUCOSAL | 0 refills | Status: AC | PRN

## 2023-04-04 NOTE — ED Provider Triage Note (Signed)
 Emergency Medicine Provider Triage Evaluation Note  Darlene Miller , a 33 y.o. female  was evaluated in triage.  Pt complains of mouth sores. Seen for same 2 days ago. Taking amoxil, diflucan, magic mouth wash. Sx worsening  Review of Systems  Positive: White patches, pain, swelling, redness Negative: Fever, bleeding, injury  Physical Exam  BP (!) 129/99 (BP Location: Right Arm)   Pulse 97   Temp 99 F (37.2 C) (Oral)   Resp 18   Ht 5' (1.524 m)   Wt 57.9 kg   SpO2 98%   BMI 24.93 kg/m  Gen:   Awake, no distress   Resp:  Normal effort  MSK:   Moves extremities without difficulty  Other:    Medical Decision Making  Medically screening exam initiated at 6:46 PM.  Appropriate orders placed.  Darlene Miller was informed that the remainder of the evaluation will be completed by another provider, this initial triage assessment does not replace that evaluation, and the importance of remaining in the ED until their evaluation is complete.  Meds ordered   Dolphus Jenny, PA-C 04/04/23 1849

## 2023-04-04 NOTE — Progress Notes (Deleted)
    NURSE VISIT NOTE  Subjective:    Patient ID: Darlene Miller, female    DOB: Aug 02, 1990, 33 y.o.   MRN: [id number]  HPI  Patient is a 33 y.o. G23P1001 female who presents for depo provera injection.   Objective:    There were no vitals taken for this visit.  Last Annual: ***. Last pap: ***. Last Depo-Provera: ***. Side Effects if any: {NONE:21772}***. Serum HCG indicated? {YES/NO:21197}. Depo-Provera 150 mg IM given by: {AOB Clinical WUJWJ:19147}. Site: {AOB INJ [id number]  Assessment:   No diagnosis found.   Plan:   Next appointment due between May 15 and May 29.    Donnetta Hail, CMA

## 2023-04-04 NOTE — Discharge Instructions (Addendum)
 Thank you for letting us evaluate you today. We have given you lidocaine for pain  Fortunately you are able to swallow and maintain secretions. I have provided you with ENT follow up to schedule an appointment for further management. Swelling could be due to trauma, viral infection, yeast infection, and/or bacterial infection. You are on all the necessary medications for these causes. I have sent another lidocaine mouthwash swish for pain. You can also try oragel that is found over the counter. Please continue medications and follow up with ENT.

## 2023-04-04 NOTE — ED Triage Notes (Signed)
 Pt states she has had mouth pain for a week, pain under tongue and on inside of lips. Saw doctor and placed on amoxicillin and mouthwash and states pain is worse.

## 2023-04-04 NOTE — ED Provider Notes (Signed)
  [facility] AT [facility] Provider Note   CSN: [id number] Arrival date & time: 04/04/23  1914     History {Add pertinent medical, surgical, social history, OB history to HPI:1} Chief Complaint  Patient presents with   Mouth Pain    Darlene Miller is a 33 y.o. female.  Augmentin, vatrex, and fluconazole, magic mouthwash. Has been getting worse Under tongue "pinpricks".  Not smoker  HPI     Home Medications Prior to Admission medications   Medication Sig Start Date End Date Taking? Authorizing Provider  amoxicillin-clavulanate (AUGMENTIN) 875-125 MG tablet Take 1 tablet by mouth 2 (two) times daily for 7 days. 04/02/23 04/09/23  Zenia Resides, MD  cetirizine (ZYRTEC) 10 MG tablet Take 1 tablet (10 mg total) by mouth at bedtime as needed for allergies. 07/27/22   Bing Neighbors, NP  fluconazole (DIFLUCAN) 150 MG tablet Take 1 tablet (150 mg total) by mouth every 3 (three) days for 2 doses. 04/02/23 04/06/23  Zenia Resides, MD  fluticasone (FLONASE) 50 MCG/ACT nasal spray Place 1 spray into both nostrils 2 (two) times daily as needed for allergies or rhinitis. 07/27/22   Bing Neighbors, NP  magic mouthwash (lidocaine, diphenhydrAMINE, alum & mag hydroxide) suspension Swish and spit 5 mLs 4 (four) times daily as needed for mouth pain. 04/02/23   Zenia Resides, MD  medroxyPROGESTERone (DEPO-PROVERA) 150 MG/ML injection Inject 150 mg into the muscle every 3 (three) months.    [provider]      Allergies    Patient has no known allergies.    Review of Systems   Review of Systems  Physical Exam Updated Vital Signs BP 125/64 (BP Location: Right Arm)   Pulse 97   Temp 98.8 F (37.1 C) (Oral)   Resp 16   Ht 5' (1.524 m)   Wt 57.9 kg   SpO2 98%   BMI 24.93 kg/m  Physical Exam HENT:     Head:     Comments: Redness, swelling, TTP of area under tongue and lower palate     ED Results / Procedures / Treatments    Labs (all labs ordered are listed, but only abnormal results are displayed) Labs Reviewed - No data to display  EKG None  Radiology No results found.  Procedures Procedures  {Document cardiac monitor, telemetry assessment procedure when appropriate:1}  Medications Ordered in ED Medications  lidocaine (XYLOCAINE) 2 % viscous mouth solution 15 mL (15 mLs Mouth/Throat Given 04/04/23 1851)    ED Course/ Medical Decision Making/ A&P   {   Click here for ABCD2, HEART and other calculatorsREFRESH Note before signing :1}                              Medical Decision Making  ***  {Document critical care time when appropriate:1} {Document review of labs and clinical decision tools ie heart score, Chads2Vasc2 etc:1}  {Document your independent review of radiology images, and any outside records:1} {Document your discussion with family members, caretakers, and with consultants:1} {Document social determinants of health affecting pt's care:1} {Document your decision making why or why not admission, treatments were needed:1} Final Clinical Impression(s) / ED Diagnoses Final diagnoses:  None    Rx / DC Orders ED Discharge Orders     None

## 2023-05-03 ENCOUNTER — Ambulatory Visit

## 2023-05-03 VITALS — BP 108/74 | HR 92 | Ht 60.0 in | Wt 130.0 lb

## 2023-05-03 DIAGNOSIS — Z3042 Encounter for surveillance of injectable contraceptive: Secondary | ICD-10-CM

## 2023-05-03 DIAGNOSIS — Z3202 Encounter for pregnancy test, result negative: Secondary | ICD-10-CM | POA: Diagnosis not present

## 2023-05-03 LAB — POCT URINE PREGNANCY: Preg Test, Ur: NEGATIVE

## 2023-05-03 MED ORDER — MEDROXYPROGESTERONE ACETATE 150 MG/ML IM SUSP
150.0000 mg | Freq: Once | INTRAMUSCULAR | Status: AC
  Administered 2023-05-03: 150 mg via INTRAMUSCULAR

## 2023-05-03 NOTE — Progress Notes (Signed)
    NURSE VISIT NOTE  Subjective:    Patient ID: Darlene Miller, female    DOB: 25-May-1990, 33 y.o.   MRN: [id number]  HPI  Patient is a 33 y.o. G43P1001 female who presents for depo provera injection.   Objective:    BP 108/74   Pulse 92   Ht 5' (1.524 m)   Wt 130 lb (59 kg)   BMI 25.39 kg/m   Last Annual: 02/20/23. Last pap: 02/20/23. Last Depo-Provera: 01/10/23. Side Effects if any: none. Urine  HCG indicated? . Depo-Provera 150 mg IM given by: Cornelius Moras, CMA. Site: Right Deltoid  Lab Review  @THIS  VISIT ONLY@  Assessment:   1. Surveillance for Depo-Provera contraception      Plan:   Next appointment due between and.6/15-6/29/25    Cornelius Moras, CMA

## 2023-07-19 ENCOUNTER — Ambulatory Visit (INDEPENDENT_AMBULATORY_CARE_PROVIDER_SITE_OTHER)

## 2023-07-19 VITALS — BP 104/72 | HR 83 | Ht 60.0 in | Wt 133.0 lb

## 2023-07-19 DIAGNOSIS — Z13820 Encounter for screening for osteoporosis: Secondary | ICD-10-CM

## 2023-07-19 DIAGNOSIS — Z3042 Encounter for surveillance of injectable contraceptive: Secondary | ICD-10-CM | POA: Diagnosis not present

## 2023-07-19 MED ORDER — MEDROXYPROGESTERONE ACETATE 150 MG/ML IM SUSP
150.0000 mg | Freq: Once | INTRAMUSCULAR | Status: AC
  Administered 2023-07-19: 150 mg via INTRAMUSCULAR

## 2023-07-19 NOTE — Addendum Note (Signed)
 Addended by: Rian Chafe D on: 07/19/2023 04:41 PM   Modules accepted: Orders

## 2023-07-19 NOTE — Progress Notes (Addendum)
    NURSE VISIT NOTE  Subjective:    Patient ID: Faye Hoops, female    DOB: 23-Jan-1991, 33 y.o.   MRN: [id number]  HPI  Patient is a 33 y.o. G47P1001 female who presents for depo provera  injection.   Objective:    BP 104/72   Pulse 83   Ht 5' (1.524 m)   Wt 133 lb (60.3 kg)   BMI 25.97 kg/m   Last Annual: 02/20/23. Last pap: 02/20/23. Last Depo-Provera : 05/03/23. Side Effects if any: none. Serum HCG indicated? No . Depo-Provera  150 mg IM given by: Rian Chafe, CMA. Site: Right Deltoid  Lab Review  No results found for any visits on 07/19/23.  Assessment:   1. Surveillance for Depo-Provera  contraception      Plan:   Next appointment due between 10/04/23 and 10/18/23. DEXA SCAN ordered per pt Dr Denman Fischer had wanted her to do, but it was never scheduled. Cherrys last note mentioned pt having the scan done then continuing Depo as long as no bone loss.   Rian Chafe, CMA

## 2023-07-26 ENCOUNTER — Ambulatory Visit

## 2023-10-10 ENCOUNTER — Ambulatory Visit

## 2023-10-11 ENCOUNTER — Ambulatory Visit

## 2023-10-11 VITALS — BP 111/72 | HR 85 | Ht 60.0 in | Wt 132.0 lb

## 2023-10-11 DIAGNOSIS — Z3042 Encounter for surveillance of injectable contraceptive: Secondary | ICD-10-CM

## 2023-10-11 MED ORDER — MEDROXYPROGESTERONE ACETATE 150 MG/ML IM SUSP
150.0000 mg | Freq: Once | INTRAMUSCULAR | Status: AC
  Administered 2023-10-11: 150 mg via INTRAMUSCULAR

## 2023-10-11 NOTE — Progress Notes (Signed)
    NURSE VISIT NOTE  Subjective:    Patient ID: Darlene Miller, female    DOB: 06-06-1990, 33 y.o.   MRN: [id number]  HPI  Patient is a 33 y.o. G60P1001 female who presents for depo provera  injection.   Objective:    BP 111/72   Pulse 85   Ht 5' (1.524 m)   Wt 132 lb (59.9 kg)   LMP 10/09/2023   BMI 25.78 kg/m   Last Annual: 02/20/23. Last pap: 02/20/23. Last Depo-Provera : 07/19/23. Side Effects if any: none. Serum HCG indicated? No . Depo-Provera  150 mg IM given by: Harlene Gander, CMA. Site: Right Deltoid  Lab Review  No results found for any visits on 10/11/23.  Assessment:   1. Surveillance for Depo-Provera  contraception      Plan:   Next appointment due between Nov 21 st and Dec 19 th,2025.    Harlene Gander, CMA

## 2023-12-31 NOTE — Progress Notes (Unsigned)
    NURSE VISIT NOTE  Subjective:    Patient ID: Darlene Miller, female    DOB: 10/05/1990, 33 y.o.   MRN: [id number]  HPI  Patient is a 33 y.o. G69P1001 female who presents for depo provera  injection.   Objective:    There were no vitals taken for this visit.  Last Annual: 02/20/2023. Last pap: 02/20/2023. Last Depo-Provera : 10/11/2023. Side Effects if any: none. Serum HCG indicated? No . Depo-Provera  150 mg IM given by: Rollo Louder, RN. Site: Left Deltoid  Lab Review  No results found for any visits on 01/01/24.  Assessment:   No diagnosis found.   Plan:   Next appointment due between 03/18/2024 and 04/01/2024.    Rollo FORBES Louder, RN

## 2024-01-01 ENCOUNTER — Ambulatory Visit

## 2024-01-01 VITALS — BP 103/71 | HR 76 | Wt 130.0 lb

## 2024-01-01 DIAGNOSIS — Z3042 Encounter for surveillance of injectable contraceptive: Secondary | ICD-10-CM | POA: Diagnosis not present

## 2024-01-01 MED ORDER — MEDROXYPROGESTERONE ACETATE 150 MG/ML IM SUSP
150.0000 mg | Freq: Once | INTRAMUSCULAR | Status: AC
  Administered 2024-01-01: 150 mg via INTRAMUSCULAR

## 2024-02-24 ENCOUNTER — Other Ambulatory Visit

## 2024-03-19 ENCOUNTER — Other Ambulatory Visit

## 2024-03-25 ENCOUNTER — Ambulatory Visit
